# Patient Record
Sex: Male | Born: 1990 | Race: White | Hispanic: No | Marital: Single | Smoking: Never smoker
Health system: Southern US, Community
[De-identification: ages and names within clinical notes are randomized; demographics above are authoritative.]

---
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
## 2011-03-16 LAB — CBC
HCT: 38.9
Hemoglobin: 13.1
MCHC: 33.4
MCV: 86.4
Platelets: 169
RBC: 4.49
WBC: 5.8
WBC: 9.2

## 2011-03-16 LAB — COMPREHENSIVE METABOLIC PANEL
ALT: 27
AST: 39 — ABNORMAL HIGH
Albumin: 4.1
Calcium: 8.9
Chloride: 104
Creatinine, Ser: 0.91
Sodium: 138

## 2011-03-16 LAB — APTT: aPTT: 26

## 2011-03-16 LAB — DIFFERENTIAL
Eosinophils Absolute: 0.3
Eosinophils Relative: 6 — ABNORMAL HIGH
Lymphocytes Relative: 41
Lymphs Abs: 2.3
Monocytes Absolute: 0.5

## 2012-04-24 ENCOUNTER — Encounter (HOSPITAL_COMMUNITY): Payer: Self-pay | Admitting: Emergency Medicine

## 2012-04-24 ENCOUNTER — Emergency Department (HOSPITAL_COMMUNITY): Payer: Self-pay

## 2012-04-24 ENCOUNTER — Emergency Department (HOSPITAL_COMMUNITY)
Admission: EM | Admit: 2012-04-24 | Discharge: 2012-04-25 | Disposition: A | Discharge status: Home / Self Care | Payer: Self-pay | Attending: Emergency Medicine | Admitting: Emergency Medicine

## 2012-04-24 DIAGNOSIS — R197 Diarrhea, unspecified: Secondary | ICD-10-CM | POA: Insufficient documentation

## 2012-04-24 DIAGNOSIS — F411 Generalized anxiety disorder: Secondary | ICD-10-CM | POA: Insufficient documentation

## 2012-04-24 DIAGNOSIS — R0789 Other chest pain: Secondary | ICD-10-CM

## 2012-04-24 DIAGNOSIS — Y929 Unspecified place or not applicable: Secondary | ICD-10-CM | POA: Insufficient documentation

## 2012-04-24 DIAGNOSIS — W1801XA Striking against sports equipment with subsequent fall, initial encounter: Secondary | ICD-10-CM | POA: Insufficient documentation

## 2012-04-24 DIAGNOSIS — R002 Palpitations: Secondary | ICD-10-CM | POA: Insufficient documentation

## 2012-04-24 DIAGNOSIS — Y9375 Activity, martial arts: Secondary | ICD-10-CM | POA: Insufficient documentation

## 2012-04-24 DIAGNOSIS — R634 Abnormal weight loss: Secondary | ICD-10-CM | POA: Insufficient documentation

## 2012-04-24 LAB — CBC WITH DIFFERENTIAL/PLATELET
Eosinophils Relative: 3 % (ref 0–5)
HCT: 41 % (ref 39.0–52.0)
Hemoglobin: 15 g/dL (ref 13.0–17.0)
Lymphocytes Relative: 37 % (ref 12–46)
Lymphs Abs: 3.2 10*3/uL (ref 0.7–4.0)
MCV: 81.5 fL (ref 78.0–100.0)
Monocytes Absolute: 0.6 10*3/uL (ref 0.1–1.0)
Monocytes Relative: 7 % (ref 3–12)
Platelets: 186 10*3/uL (ref 150–400)
RBC: 5.03 MIL/uL (ref 4.22–5.81)
WBC: 8.6 10*3/uL (ref 4.0–10.5)

## 2012-04-24 MED ORDER — CYCLOBENZAPRINE HCL 10 MG PO TABS
5.0000 mg | ORAL_TABLET | Freq: Once | ORAL | Status: AC
  Administered 2012-04-24: 5 mg via ORAL
  Filled 2012-04-24: qty 1

## 2012-04-24 MED ORDER — IBUPROFEN 800 MG PO TABS
800.0000 mg | ORAL_TABLET | Freq: Once | ORAL | Status: AC
  Administered 2012-04-24: 800 mg via ORAL
  Filled 2012-04-24: qty 1

## 2012-04-24 NOTE — ED Provider Notes (Signed)
History     CSN: [id number]  Arrival date & time 04/24/12  2202   First MD Initiated Contact with Patient 04/24/12 2257      Chief Complaint  Patient presents with  . Chest Pain  . Anxiety    (Consider location/radiation/quality/duration/timing/severity/associated sxs/prior treatment) HPI  Patient reports over 2 weeks ago he was working at a [employer] and he did a karate jump and fell landing on his left chest. He states it knocked the breath out of him for a short while. He relates  several days later he started having a dull pain in his left chest and middle of his chest that would come and go. He states it would last a few seconds. He also had a sharp pain that was more of a  jabbing pain that would also come and go. He reports over the past 2 weeks the pains have started occurring more often and lasting longer. He relates today he has had chest pain all day that he describes as a pushing on his heart. He also is getting sharp jabs. He states he has palpitations and feels like his heart is racing at times. He has mild shortness of breath but denies cough he denies any pain or swelling in his legs. He relates a couple days ago he felt some tingling in his left hand. He thinks he may have had some wheezing with this episode. He also reports he is having increasing anxiety. He was seen by his family physician recently for anxiety and started on Xanax which he states is not helping.  Patient also reports a prolonged episode of diarrhea and has had significant weight loss from the 135 pound range down to 115 pounds. He relates he was evaluated by his physician and no etiology of the diarrhea was found. The diarrhea is improving but still not resolved.  He has an appointment to see his doctor in 3 days.  PCP Dr. Tanya Nones at [facility] family practice  History reviewed. No pertinent past medical history.  History reviewed. No pertinent past surgical history.  History reviewed.  No pertinent family history. No CAD or heart problems.   History  Substance Use Topics  . Smoking status: Never Smoker   . Smokeless tobacco: Not on file  . Alcohol Use: No  THC 3 times a day, stopped recently employed   Review of Systems  All other systems reviewed and are negative.    Allergies  Review of patient's allergies indicates no known allergies.  Home Medications   Current Outpatient Rx  Name  Route  Sig  Dispense  Refill  . ALPRAZOLAM 0.5 MG PO TABS   Oral   Take 0.5 mg by mouth 2 (two) times daily as needed. For anxiety           BP 130/76  Pulse 73  Temp 98.1 F (36.7 C) (Oral)  Resp 12  SpO2 100%  Vital signs normal    Physical Exam  Nursing note and vitals reviewed. Constitutional: He is oriented to person, place, and time. He appears well-developed and well-nourished.  Non-toxic appearance. He does not appear ill. No distress.  HENT:  Head: Normocephalic and atraumatic.  Right Ear: External ear normal.  Left Ear: External ear normal.  Nose: Nose normal. No mucosal edema or rhinorrhea.  Mouth/Throat: Oropharynx is clear and moist and mucous membranes are normal. No dental abscesses or uvula swelling.  Eyes: Conjunctivae normal and EOM are normal. Pupils are equal, round,  and reactive to light.  Neck: Normal range of motion and full passive range of motion without pain. Neck supple.  Cardiovascular: Normal rate, regular rhythm and normal heart sounds.  Exam reveals no gallop and no friction rub.   No murmur heard. Pulmonary/Chest: Effort normal and breath sounds normal. No respiratory distress. He has no wheezes. He has no rhonchi. He has no rales. He exhibits tenderness. He exhibits no crepitus.    Abdominal: Soft. Normal appearance and bowel sounds are normal. He exhibits no distension. There is no tenderness. There is no rebound and no guarding.  Musculoskeletal: Normal range of motion. He exhibits no edema and no tenderness.       Moves  all extremities well.   Neurological: He is alert and oriented to person, place, and time. He has normal strength. No cranial nerve deficit.  Skin: Skin is warm, dry and intact. No rash noted. No erythema. No pallor.  Psychiatric: He has a normal mood and affect. His speech is normal and behavior is normal. His mood appears not anxious.       Mildly anxious    ED Course  Procedures (including critical care time)   Medications  ALPRAZolam (XANAX) 0.5 MG tablet (not administered)  acetaminophen (TYLENOL) tablet 1,000 mg (not administered)  ibuprofen (ADVIL,MOTRIN) tablet 800 mg (800 mg Oral Given 04/24/12 2339)  cyclobenzaprine (FLEXERIL) tablet 5 mg (5 mg Oral Given 04/24/12 2339)    00:50Recheck chest pain is mildly better, now has a headache. Given results of xrays.  02:00 sleeping, chest pain is gone. Ready to go home.   Results for orders placed during the hospital encounter of 04/24/12  POCT I-STAT TROPONIN I      Component Value Range   Troponin i, poc 0.00  0.00 - 0.08 ng/mL   Comment 3           CBC WITH DIFFERENTIAL      Component Value Range   WBC 8.6  4.0 - 10.5 K/uL   RBC 5.03  4.22 - 5.81 MIL/uL   Hemoglobin 15.0  13.0 - 17.0 g/dL   HCT 16.1  09.6 - 04.5 %   MCV 81.5  78.0 - 100.0 fL   MCH 29.8  26.0 - 34.0 pg   MCHC 36.6 (*) 30.0 - 36.0 g/dL   RDW 40.9  81.1 - 91.4 %   Platelets 186  150 - 400 K/uL   Neutrophils Relative 52  43 - 77 %   Neutro Abs 4.5  1.7 - 7.7 K/uL   Lymphocytes Relative 37  12 - 46 %   Lymphs Abs 3.2  0.7 - 4.0 K/uL   Monocytes Relative 7  3 - 12 %   Monocytes Absolute 0.6  0.1 - 1.0 K/uL   Eosinophils Relative 3  0 - 5 %   Eosinophils Absolute 0.3  0.0 - 0.7 K/uL   Basophils Relative 1  0 - 1 %   Basophils Absolute 0.0  0.0 - 0.1 K/uL  COMPREHENSIVE METABOLIC PANEL      Component Value Range   Sodium 139  135 - 145 mEq/L   Potassium 3.7  3.5 - 5.1 mEq/L   Chloride 103  96 - 112 mEq/L   CO2 26  19 - 32 mEq/L   Glucose, Bld 86  70  - 99 mg/dL   BUN 26 (*) 6 - 23 mg/dL   Creatinine, Ser 7.82  0.50 - 1.35 mg/dL   Calcium 9.4  8.4 - 95.6  mg/dL   Total Protein 6.9  6.0 - 8.3 g/dL   Albumin 4.2  3.5 - 5.2 g/dL   AST 22  0 - 37 U/L   ALT 14  0 - 53 U/L   Alkaline Phosphatase 82  39 - 117 U/L   Total Bilirubin 0.3  0.3 - 1.2 mg/dL   GFR calc non Af Amer >90  >90 mL/min   GFR calc Af Amer >90  >90 mL/min  URINALYSIS, ROUTINE W REFLEX MICROSCOPIC      Component Value Range   Color, Urine YELLOW  YELLOW   APPearance CLEAR  CLEAR   Specific Gravity, Urine 1.017  1.005 - 1.030   pH 7.5  5.0 - 8.0   Glucose, UA NEGATIVE  NEGATIVE mg/dL   Hgb urine dipstick NEGATIVE  NEGATIVE   Bilirubin Urine NEGATIVE  NEGATIVE   Ketones, ur NEGATIVE  NEGATIVE mg/dL   Protein, ur NEGATIVE  NEGATIVE mg/dL   Urobilinogen, UA 0.2  0.0 - 1.0 mg/dL   Nitrite NEGATIVE  NEGATIVE   Leukocytes, UA NEGATIVE  NEGATIVE   Laboratory interpretation all normal   Dg Ribs Unilateral W/chest Left  04/24/2012  *RADIOLOGY REPORT*  Clinical Data: Chest pain and anxiety  LEFT RIBS AND CHEST - 3+ VIEW  Comparison: 04/21/2008  Findings: The heart size and mediastinal contours are within normal limits.  Both lungs are clear.  The visualized skeletal structures are unremarkable.  IMPRESSION: Negative exam.   Original Report Authenticated By: Signa Kell, M.D.       Date: 04/25/2012  Rate: 78  Rhythm: normal sinus rhythm  QRS Axis: normal  Intervals: normal  ST/T Wave abnormalities: normal  Conduction Disutrbances:none  Narrative Interpretation:   Old EKG Reviewed: none available    1. Chest wall pain   2. Weight loss   3. Palpitations   4. Diarrhea     New Prescriptions   METHOCARBAMOL (ROBAXIN) 500 MG TABLET    Take 1 or 2 po Q 6hrs for pain  ibuprofen  Plan discharge  Devoria Albe, MD, FACEP   MDM          Ward Givens, MD 04/25/12 [phone]

## 2012-04-24 NOTE — ED Notes (Signed)
Patient transported to X-ray 

## 2012-04-24 NOTE — ED Notes (Signed)
Pt reports chest pain x1-2 weeks intermitant and recurrent hx anxiety attacks denies cardiac hx

## 2012-04-25 LAB — URINALYSIS, ROUTINE W REFLEX MICROSCOPIC
Bilirubin Urine: NEGATIVE
Ketones, ur: NEGATIVE mg/dL
Nitrite: NEGATIVE
Specific Gravity, Urine: 1.017 (ref 1.005–1.030)
Urobilinogen, UA: 0.2 mg/dL (ref 0.0–1.0)

## 2012-04-25 LAB — COMPREHENSIVE METABOLIC PANEL
ALT: 14 U/L (ref 0–53)
CO2: 26 mEq/L (ref 19–32)
Calcium: 9.4 mg/dL (ref 8.4–10.5)
GFR calc Af Amer: >90 mL/min (ref >90)
GFR calc non Af Amer: >90 mL/min (ref >90)
Glucose, Bld: 86 mg/dL (ref 70–99)
Sodium: 139 mEq/L (ref 135–145)

## 2012-04-25 MED ORDER — METHOCARBAMOL 500 MG PO TABS: ORAL_TABLET | ORAL | Status: DC

## 2012-04-25 MED ORDER — ACETAMINOPHEN 500 MG PO TABS
1000.0000 mg | ORAL_TABLET | Freq: Once | ORAL | Status: AC
  Administered 2012-04-25: 1000 mg via ORAL
  Filled 2012-04-25: qty 2

## 2015-07-27 ENCOUNTER — Emergency Department (HOSPITAL_COMMUNITY): Payer: No Typology Code available for payment source

## 2015-07-27 ENCOUNTER — Emergency Department (HOSPITAL_COMMUNITY)
Admission: EM | Admit: 2015-07-27 | Discharge: 2015-07-27 | Disposition: A | Discharge status: Home / Self Care | Payer: No Typology Code available for payment source | Attending: Emergency Medicine | Admitting: Emergency Medicine

## 2015-07-27 ENCOUNTER — Encounter (HOSPITAL_COMMUNITY): Payer: Self-pay | Admitting: Emergency Medicine

## 2015-07-27 DIAGNOSIS — Y990 Civilian activity done for income or pay: Secondary | ICD-10-CM | POA: Insufficient documentation

## 2015-07-27 DIAGNOSIS — Y9389 Activity, other specified: Secondary | ICD-10-CM | POA: Insufficient documentation

## 2015-07-27 DIAGNOSIS — S93401A Sprain of unspecified ligament of right ankle, initial encounter: Secondary | ICD-10-CM | POA: Insufficient documentation

## 2015-07-27 DIAGNOSIS — Y92830 Public park as the place of occurrence of the external cause: Secondary | ICD-10-CM | POA: Insufficient documentation

## 2015-07-27 DIAGNOSIS — X58XXXA Exposure to other specified factors, initial encounter: Secondary | ICD-10-CM | POA: Insufficient documentation

## 2015-07-27 MED ORDER — IBUPROFEN 800 MG PO TABS
800.0000 mg | ORAL_TABLET | Freq: Once | ORAL | Status: AC
  Administered 2015-07-27: 800 mg via ORAL
  Filled 2015-07-27: qty 1

## 2015-07-27 MED ORDER — ACETAMINOPHEN 325 MG PO TABS
650.0000 mg | ORAL_TABLET | Freq: Once | ORAL | Status: AC
  Administered 2015-07-27: 650 mg via ORAL
  Filled 2015-07-27: qty 2

## 2015-07-27 NOTE — ED Notes (Signed)
Patient presents for right ankle injury. 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 Swelling noted to same. Denies numbness or tingling. Rates pain 4/10. Two aspirin PTA.

## 2015-07-27 NOTE — Discharge Instructions (Signed)
Ankle Sprain  An ankle sprain is an injury to the strong, fibrous tissues (ligaments) that hold the bones of your ankle joint together.   CAUSES  An ankle sprain is usually caused by a fall or by twisting your ankle. Ankle sprains most commonly occur when you step on the outer edge of your foot, and your ankle turns inward. People who participate in sports are more prone to these types of injuries.   SYMPTOMS    Pain in your ankle. The pain may be present at rest or only when you are trying to stand or walk.   Swelling.   Bruising. Bruising may develop immediately or within 1 to 2 days after your injury.   Difficulty standing or walking, particularly when turning corners or changing directions.  DIAGNOSIS   Your caregiver will ask you details about your injury and perform a physical exam of your ankle to determine if you have an ankle sprain. During the physical exam, your caregiver will press on and apply pressure to specific areas of your foot and ankle. Your caregiver will try to move your ankle in certain ways. An X-ray exam may be done to be sure a bone was not broken or a ligament did not separate from one of the bones in your ankle (avulsion fracture).   TREATMENT   Certain types of braces can help stabilize your ankle. Your caregiver can make a recommendation for this. Your caregiver may recommend the use of medicine for pain. If your sprain is severe, your caregiver may refer you to a surgeon who helps to restore function to parts of your skeletal system (orthopedist) or a physical therapist.  HOME CARE INSTRUCTIONS    Apply ice to your injury for 1-2 days or as directed by your caregiver. Applying ice helps to reduce inflammation and pain.    Put ice in a plastic bag.    Place a towel between your skin and the bag.    Leave the ice on for 15-20 minutes at a time, every 2 hours while you are awake.   Only take over-the-counter or prescription medicines for pain, discomfort, or fever as directed by  your caregiver.   Elevate your injured ankle above the level of your heart as much as possible for 2-3 days.   If your caregiver recommends crutches, use them as instructed. Gradually put weight on the affected ankle. Continue to use crutches or a cane until you can walk without feeling pain in your ankle.   If you have a plaster splint, wear the splint as directed by your caregiver. Do not rest it on anything harder than a pillow for the first 24 hours. Do not put weight on it. Do not get it wet. You may take it off to take a shower or bath.   You may have been given an elastic bandage to wear around your ankle to provide support. If the elastic bandage is too tight (you have numbness or tingling in your foot or your foot becomes cold and blue), adjust the bandage to make it comfortable.   If you have an air splint, you may blow more air into it or let air out to make it more comfortable. You may take your splint off at night and before taking a shower or bath. Wiggle your toes in the splint several times per day to decrease swelling.  SEEK MEDICAL CARE IF:    You have rapidly increasing bruising or swelling.   Your toes feel   extremely cold or you lose feeling in your foot.   Your pain is not relieved with medicine.  SEEK IMMEDIATE MEDICAL CARE IF:   Your toes are numb or blue.   You have severe pain that is increasing.  MAKE SURE YOU:    Understand these instructions.   Will watch your condition.   Will get help right away if you are not doing well or get worse.     This information is not intended to replace advice given to you by your health care provider. Make sure you discuss any questions you have with your health care provider.     Document Released: 05/31/2005 Document Revised: 06/21/2014 Document Reviewed: 06/12/2011  Elsevier Interactive Patient Education 2016 [employer]

## 2015-07-27 NOTE — ED Provider Notes (Signed)
CSN: [id number]     Arrival date & time 07/27/15  1740 History  By signing my name below, I, Edward Jarvis, attest that this documentation has been prepared under the direction and in the presence of Edward Fowler, PA-C.  Electronically Signed: Murriel Jarvis, ED Scribe. 07/27/2015. 7:45 PM.   Chief Complaint  Patient presents with  . Ankle Injury      Patient is a 25 y.o. male presenting with lower extremity injury. The history is provided by the patient. No language interpreter was used.  Ankle Injury   HPI Comments: Edward Jarvis is a 25 y.o. male who presents to the Emergency Department complaining of constant, moderate, worsening right ankle pain that has been present for 3 hours. 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 Pt states he has not put weight on it since the injury occurred. Pt reports associated tingling in his toes (which has resolved) and swelling of the foot. Pt states he took an Aspirin earlier with mild relief. Pt denies hitting his head or losing consciousness during the injury.   History reviewed. No pertinent past medical history. History reviewed. No pertinent past surgical history. No family history on file. Social History  Substance Use Topics  . Smoking status: Never Smoker   . Smokeless tobacco: None  . Alcohol Use: No    Review of Systems  A complete 10 system review of systems was obtained and all systems are negative except as noted in the HPI and PMH.    Allergies  Poison ivy extract  Home Medications   Prior to Admission medications   Medication Sig Start Date End Date Taking? Authorizing Provider  ALPRAZolam Prudy Feeler) 0.5 MG tablet Take 0.5 mg by mouth 2 (two) times daily as needed. For anxiety    Historical Provider, MD  methocarbamol (ROBAXIN) 500 MG tablet Take 1 or 2 po Q 6hrs for pain 04/25/12   Devoria Albe, MD   BP 109/73 mmHg  Pulse 70  Temp(Src) 98.4 F (36.9 C)  (Oral)  Resp 18  Ht  (1.727 m)  Wt 58.968 kg  BMI 19.77 kg/m2  SpO2 100% Physical Exam  Constitutional: He is oriented to person, place, and time. He appears well-developed and well-nourished.  HENT:  Head: Normocephalic and atraumatic.  Right Ear: External ear normal.  Left Ear: External ear normal.  Eyes: Conjunctivae are normal. No scleral icterus.  Neck: No tracheal deviation present.  Cardiovascular:  Pulses:      Dorsalis pedis pulses are 2+ on the right side, and 2+ on the left side.  Capillary refill less than 3 seconds distal to injury.   Pulmonary/Chest: Effort normal. No respiratory distress.  Abdominal: He exhibits no distension.  Musculoskeletal: He exhibits edema and tenderness.       Right ankle: He exhibits decreased range of motion (secondary to pain) and swelling. He exhibits no ecchymosis, no deformity, no laceration and normal pulse. Tenderness. Lateral malleolus, AITFL and CF ligament tenderness found. No posterior TFL tenderness found. Achilles tendon normal.  Right ankle with moderate swelling.  No ecchymosis, deformity, or erythema.  Lateral ankle TTP. Compartments soft and compressible. Decreased ROM secondary to pain.  Patient able to wiggle all toes without difficulty.  Neurological: He is alert and oriented to person, place, and time.  Strength and Sensation intact distal to injury.  Skin: Skin is warm and dry.  Psychiatric: He has a normal mood and  affect. His behavior is normal.    ED Course  Procedures (including critical care time)  DIAGNOSTIC STUDIES: Oxygen Saturation is 100% on room air, normal by my interpretation.    COORDINATION OF CARE: 7:43 PM Discussed treatment plan with pt at bedside and pt agreed to plan.   Labs Review Labs Reviewed - No data to display  Imaging Review Dg Ankle Complete Right  07/27/2015  CLINICAL DATA:  She swelling. EXAM: RIGHT ANKLE - COMPLETE 3+ VIEW COMPARISON:  She swelling. FINDINGS: The  mineralization and alignment are normal. There is no evidence of acute fracture or dislocation. There is no widening of the ankle mortise. The joint spaces are maintained. There is mild lateral soft tissue swelling. IMPRESSION: No acute osseous findings. Electronically Signed   By: Carey Bullocks M.D.   On: 07/27/2015 18:38   I have personally reviewed and evaluated these images and lab results as part of my medical decision-making.   EKG Interpretation None      MDM   Final diagnoses:  Ankle sprain, right, initial encounter    VSS, NAD.  NVI distal to injury.  Plain films negative for acute findings or fracture. Patient given ASO and crutches in ED.  Pain control with Motrin and Tylenol.  RICE therapy.  Follow up with orthopedics.  Discussed return precautions.  Patient agrees and acknowledges the above plan for discharge.   I personally performed the services described in this documentation, which was scribed in my presence. The recorded information has been reviewed and is accurate.    Edward Fowler, PA-C 07/27/15 1951  Edward Fowler, PA-C 07/27/15 1952  Lorre Nick, MD 07/30/15 [phone]

## 2016-04-21 ENCOUNTER — Encounter (HOSPITAL_COMMUNITY): Payer: Self-pay | Admitting: Family Medicine

## 2016-04-21 ENCOUNTER — Ambulatory Visit (HOSPITAL_COMMUNITY)
Admission: EM | Admit: 2016-04-21 | Discharge: 2016-04-21 | Disposition: A | Discharge status: Home / Self Care | Payer: Commercial Managed Care - HMO | Attending: Family Medicine | Admitting: Family Medicine

## 2016-04-21 DIAGNOSIS — S0990XA Unspecified injury of head, initial encounter: Secondary | ICD-10-CM | POA: Diagnosis not present
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
## 2016-04-21 NOTE — ED Provider Notes (Signed)
[facility]    CSN: [id number] Arrival date & time: 04/21/16  1138     History   Chief Complaint Chief Complaint  Patient presents with  . Head Injury    HPI Edward Jarvis is a 25 y.o. male with no PMH presenting after head trauma.   Yesterday evening he was installing a metal fence when a pole leaning up against a wall slid and fell onto the top of his head. It caused bleeding and mild headache without other symptoms. No LOC. He continued working, went to sleep, and when he woke up this morning he felt nauseous and had a single episode of vomiting. He has drunk water since that time and had no nausea. He notes no vision changes, hearing changes, syncope, focal weakness, numbness, tingling, changes in concentration or personality. Has a history of head trauma when he was 15 and fell from a skateboard.   HPI  History reviewed. No pertinent past medical history.  There are no active problems to display for this patient.   History reviewed. No pertinent surgical history.     Home Medications    Prior to Admission medications   Not on File    Family History History reviewed. No pertinent family history.  Social History Social History  Substance Use Topics  . Smoking status: Never Smoker  . Smokeless tobacco: Never Used  . Alcohol use No     Allergies   Poison ivy extract [poison ivy extract]   Review of Systems Review of Systems   Physical Exam Triage Vital Signs ED Triage Vitals [04/21/16 1206]  Enc Vitals Group     BP 105/64     Pulse Rate 66     Resp 16     Temp 98.3 F (36.8 C)     Temp src      SpO2 98 %     Weight      Height      Head Circumference      Peak Flow      Pain Score 4     Pain Loc      Pain Edu?      Excl. in GC?    No data found.   Updated Vital Signs BP 105/64   Pulse 66   Temp 98.3 F (36.8 C)   Resp 16   SpO2 98%   Physical Exam  Constitutional: He is oriented to person, place, and time. He  appears well-developed and well-nourished. No distress.  Eyes: EOM are normal. Pupils are equal, round, and reactive to light. No scleral icterus.  Neck: Neck supple. No JVD present.  Cardiovascular: Normal rate, regular rhythm, normal heart sounds and intact distal pulses.   No murmur heard. Pulmonary/Chest: Effort normal and breath sounds normal. No respiratory distress.  Abdominal: Soft. Bowel sounds are normal. He exhibits no distension. There is no tenderness.  Musculoskeletal: Normal range of motion. He exhibits no edema or tenderness.  Lymphadenopathy:    He has no cervical adenopathy.  Neurological: He is alert and oriented to person, place, and time. He displays normal reflexes. No cranial nerve deficit or sensory deficit. He exhibits normal muscle tone. Coordination normal.  fundi benign. One-leg stand normal, romberg neg.  Skin: Skin is warm and dry.  hemostatic small <0.5cm abrasion on superior right frontal scalp. no laceration  Vitals reviewed.  UC Treatments / Results  Labs (all labs ordered are listed, but only abnormal results are displayed) Labs Reviewed - No data to  display  EKG  EKG Interpretation None       Radiology No results found.  Procedures Procedures (including critical care time)  Medications Ordered in UC Medications - No data to display   Initial Impression / Assessment and Plan / UC Course  I have reviewed the triage vital signs and the nursing notes.  Pertinent labs & imaging results that were available during my care of the patient were reviewed by me and considered in my medical decision making (see chart for details).   Final Clinical Impressions(s) / UC Diagnoses   Final diagnoses:  Closed head injury, initial encounter   25 y.o. male presenting the day after low risk head trauma with nausea and an episode of emesis, both resolved. Advised supportive care including avoidance of high risk situations for head trauma. He doesn't play  contact sports and job does not put at risk of trauma. Symptoms of concussion reviewed and urged to go to ED if focal symptoms arise for head imaging .  New Prescriptions New Prescriptions   No medications on file     Tyrone Nineyan B Trexton Escamilla, MD 04/21/16 1319

## 2016-07-16 ENCOUNTER — Emergency Department (HOSPITAL_COMMUNITY)
Admission: EM | Admit: 2016-07-16 | Discharge: 2016-07-16 | Disposition: A | Discharge status: Home / Self Care | Payer: Commercial Managed Care - HMO | Attending: Emergency Medicine | Admitting: Emergency Medicine

## 2016-07-16 ENCOUNTER — Emergency Department (HOSPITAL_COMMUNITY): Payer: Commercial Managed Care - HMO

## 2016-07-16 ENCOUNTER — Encounter (HOSPITAL_COMMUNITY): Payer: Self-pay | Admitting: Emergency Medicine

## 2016-07-16 DIAGNOSIS — X58XXXA Exposure to other specified factors, initial encounter: Secondary | ICD-10-CM | POA: Diagnosis not present

## 2016-07-16 DIAGNOSIS — Z79899 Other long term (current) drug therapy: Secondary | ICD-10-CM | POA: Diagnosis not present

## 2016-07-16 DIAGNOSIS — S93601A Unspecified sprain of right foot, initial encounter: Secondary | ICD-10-CM

## 2016-07-16 DIAGNOSIS — Y9301 Activity, walking, marching and hiking: Secondary | ICD-10-CM | POA: Diagnosis not present

## 2016-07-16 DIAGNOSIS — Y929 Unspecified place or not applicable: Secondary | ICD-10-CM | POA: Insufficient documentation

## 2016-07-16 DIAGNOSIS — S99921A Unspecified injury of right foot, initial encounter: Secondary | ICD-10-CM | POA: Diagnosis present

## 2016-07-16 DIAGNOSIS — Y999 Unspecified external cause status: Secondary | ICD-10-CM | POA: Diagnosis not present

## 2016-07-16 MED ORDER — IBUPROFEN 800 MG PO TABS
800.0000 mg | ORAL_TABLET | Freq: Once | ORAL | Status: AC
  Administered 2016-07-16: 800 mg via ORAL
  Filled 2016-07-16: qty 1

## 2016-07-16 MED ORDER — OXYCODONE-ACETAMINOPHEN 5-325 MG PO TABS: 1.0000 | ORAL_TABLET | ORAL | 0 refills | Status: AC | PRN

## 2016-07-16 NOTE — Discharge Instructions (Signed)
Take acetaminophen, ibuprofen, or naproxen for less severe pain.

## 2016-07-16 NOTE — ED Notes (Signed)
ASO right ankle brace applied and crutches given and adjusted to height of 5'8". Patient demonstrated use of crutches and verbalized he was comfortable with use. D/C information reviewed and pt is aware of follow-up care and RX. Ice pack taken home with also.

## 2016-07-16 NOTE — ED Triage Notes (Signed)
Patient states that he took his dog out and stepped on his foot wrong. Patients says he heard something pop and not it hurts to walk on it.

## 2016-07-16 NOTE — ED Notes (Signed)
Patient states "I was walking my dog yesterday evening around 6-7pm. I stepped down and heard my right foot/ankle pop. It didn't alarm me because I am a runner and sometimes my joints pop. I was able to keep walking but it was a little sore. I went to bed and it was throbbing woke me up. I got up to use the bathroom and could barely put weight on my foot."

## 2016-07-16 NOTE — ED Provider Notes (Signed)
[facility] Provider Note   CSN: [id number] Arrival date & time: 07/16/16  0447     History   Chief Complaint Chief Complaint  Patient presents with  . Foot Injury    HPI Edward Jarvis is a 26 y.o. male.  He was DOG when he felt something pop in the lateral aspect of his right foot. Since then, he has been having increasing pain in the right foot with pain worse across the lateral aspect. He is unable to bear. He rates pain at 10/10. He denies actually twisting his foot. He states that he usually does walk on the outside part of his foot and his foot frequently pops, but this the first time it has hurt like this. He treated it with aspirin at home with no relief.   The history is provided by the patient.  Foot Injury      History reviewed. No pertinent past medical history.  There are no active problems to display for this patient.   History reviewed. No pertinent surgical history.     Home Medications    Prior to Admission medications   Medication Sig Start Date End Date Taking? Authorizing Provider  oxyCODONE-acetaminophen (PERCOCET) 5-325 MG tablet Take 1 tablet by mouth every 4 (four) hours as needed for moderate pain. 07/16/16   Dione Boozeavid Stella Encarnacion, MD    Family History History reviewed. No pertinent family history.  Social History Social History  Substance Use Topics  . Smoking status: Never Smoker  . Smokeless tobacco: Never Used  . Alcohol use No     Allergies   Poison ivy extract [poison ivy extract]   Review of Systems Review of Systems  All other systems reviewed and are negative.    Physical Exam Updated Vital Signs BP (!) 137/109 (BP Location: Left Arm)   Pulse 66   Temp 97.6 F (36.4 C) (Oral)   Resp 18   Ht 5\' 8"  (1.727 m)   Wt 132 lb (59.9 kg)   SpO2 100%   BMI 20.07 kg/m   Physical Exam  Nursing note and vitals reviewed.  26 year old male, resting comfortably and in no acute distress. Vital signs are significant for  hypertension. Oxygen saturation is 100%, which is normal. Head is normocephalic and atraumatic. PERRLA, EOMI. Oropharynx is clear. Neck is nontender and supple without adenopathy or JVD. Back is nontender and there is no CVA tenderness. Lungs are clear without rales, wheezes, or rhonchi. Chest is nontender. Heart has regular rate and rhythm without murmur. Abdomen is soft, flat, nontender without masses or hepatosplenomegaly and peristalsis is normoactive. Extremities: There is slight swelling across the dorsum and lateral aspects of the right foot with marked tenderness diffusely through the foot. Tenderness is worse along the lateral aspect. There is no instability. Neurovascular exam is intact. Skin is warm and dry without rash. Neurologic: Mental status is normal, cranial nerves are intact, there are no motor or sensory deficits.  ED Treatments / Results   Radiology Dg Foot Complete Right  Result Date: 07/16/2016 CLINICAL DATA:  Right lateral foot pain after injury. Stepped wrong and heard a pop. EXAM: RIGHT FOOT COMPLETE - 3+ VIEW COMPARISON:  None. FINDINGS: There is no evidence of fracture or dislocation. There is no evidence of arthropathy or other focal bone abnormality. Incidental note of multipartite sesamoids. Soft tissues are unremarkable. IMPRESSION: Negative radiographs of the right foot. Electronically Signed   By: Rubye OaksMelanie  Ehinger M.D.   On: 07/16/2016 05:17    Procedures  Procedures (including critical care time)  Medications Ordered in ED Medications  ibuprofen (ADVIL,MOTRIN) tablet 800 mg (not administered)     Initial Impression / Assessment and Plan / ED Course  I have reviewed the triage vital signs and the nursing notes.  Pertinent imaging results that were available during my care of the patient were reviewed by me and considered in my medical decision making (see chart for details).  Right foot injury which is probably a sprain. X-rays show no bony injury. He  is placed in an ankle splint orthotic and given crutches and discharged with instructions use over-the-counter analgesics as needed for pain. Given a prescription for a small number of oxycodone-acetaminophen tablets. Referred to orthopedics for follow-up. Review of old records shows no relevant past visits.  Final Clinical Impressions(s) / ED Diagnoses   Final diagnoses:  Sprain of right foot, initial encounter    New Prescriptions New Prescriptions   OXYCODONE-ACETAMINOPHEN (PERCOCET) 5-325 MG TABLET    Take 1 tablet by mouth every 4 (four) hours as needed for moderate pain.     Dione Booze, MD 07/16/16 [phone]

## 2017-02-05 ENCOUNTER — Emergency Department (HOSPITAL_COMMUNITY): Payer: 59

## 2017-02-05 ENCOUNTER — Emergency Department (HOSPITAL_COMMUNITY)
Admission: EM | Admit: 2017-02-05 | Discharge: 2017-02-05 | Disposition: A | Discharge status: Home / Self Care | Payer: 59 | Attending: Emergency Medicine | Admitting: Emergency Medicine

## 2017-02-05 ENCOUNTER — Other Ambulatory Visit: Payer: Self-pay

## 2017-02-05 ENCOUNTER — Encounter (HOSPITAL_COMMUNITY): Payer: Self-pay | Admitting: Emergency Medicine

## 2017-02-05 DIAGNOSIS — R042 Hemoptysis: Secondary | ICD-10-CM

## 2017-02-05 DIAGNOSIS — Z720 Tobacco use: Secondary | ICD-10-CM | POA: Diagnosis not present

## 2017-02-05 DIAGNOSIS — J069 Acute upper respiratory infection, unspecified: Secondary | ICD-10-CM

## 2017-02-05 LAB — CBC WITH DIFFERENTIAL/PLATELET
BASOS ABS: 0 10*3/uL (ref 0.0–0.1)
Basophils Relative: 1 %
EOS PCT: 5 %
Eosinophils Absolute: 0.4 10*3/uL (ref 0.0–0.7)
HCT: 43.4 % (ref 39.0–52.0)
Hemoglobin: 14.9 g/dL (ref 13.0–17.0)
LYMPHS ABS: 2.3 10*3/uL (ref 0.7–4.0)
LYMPHS PCT: 32 %
MCH: 29.1 pg (ref 26.0–34.0)
MCHC: 34.3 g/dL (ref 30.0–36.0)
MCV: 84.8 fL (ref 78.0–100.0)
MONO ABS: 0.6 10*3/uL (ref 0.1–1.0)
MONOS PCT: 8 %
NEUTROS ABS: 4 10*3/uL (ref 1.7–7.7)
Neutrophils Relative %: 54 %
Platelets: 180 10*3/uL (ref 150–400)
RBC: 5.12 MIL/uL (ref 4.22–5.81)
RDW: 12.5 % (ref 11.5–15.5)
WBC: 7.2 10*3/uL (ref 4.0–10.5)

## 2017-02-05 LAB — BASIC METABOLIC PANEL
ANION GAP: 7 (ref 5–15)
BUN: 11 mg/dL (ref 6–20)
CALCIUM: 9.3 mg/dL (ref 8.9–10.3)
CO2: 28 mmol/L (ref 22–32)
Chloride: 104 mmol/L (ref 101–111)
Creatinine, Ser: 0.96 mg/dL (ref 0.61–1.24)
GFR calc Af Amer: >60 mL/min (ref >60)
GFR calc non Af Amer: >60 mL/min (ref >60)
GLUCOSE: 87 mg/dL (ref 65–99)
Potassium: 5.1 mmol/L (ref 3.5–5.1)
Sodium: 139 mmol/L (ref 135–145)

## 2017-02-05 LAB — I-STAT TROPONIN, ED: Troponin i, poc: 0 ng/mL (ref 0.00–0.08)

## 2017-02-05 LAB — D-DIMER, QUANTITATIVE: D-Dimer, Quant: <0.27 ug/mL-FEU (ref 0.00–0.50)

## 2017-02-05 NOTE — ED Triage Notes (Signed)
Pt states that he coughed up blood x 2 this am in shower-- pt having no resp distress.

## 2017-02-05 NOTE — Discharge Instructions (Signed)
Your work up today has been reassuring, your labs, EKG, and chest xray all looked good. The symptoms you had today could be from a viral infection or from the change in weather, and the blood could have come from either your postnasal drainage or from the back of your throat. Continue to stay well-hydrated. Gargle warm salt water and spit it out, and use chloraseptic spray as needed for sore throat. Continue to alternate between Tylenol and Ibuprofen for pain or fever. Use Mucinex for cough suppression/expectoration of mucus. Use netipot and flonase to help with nasal congestion. May consider over-the-counter Benadryl or other antihistamine to decrease secretions and for help with your symptoms. STOP SMOKING! Follow up with your primary care doctor in 5-7 days for recheck of ongoing symptoms. Return to emergency department for emergent changing or worsening of symptoms.

## 2017-02-05 NOTE — ED Provider Notes (Signed)
[facility] Provider Note   CSN: [id number] Arrival date & time: 02/05/17  4098     History   Chief Complaint Chief Complaint  Patient presents with  . Hemoptysis    HPI Edward Jarvis is a 26 y.o. male who presents to the ED with complaints of 2 episodes of hemoptysis this morning when he was in the shower. Patient states that he started coughing after he got in the shower, had some brown mucus, and then had a to small plug of mucus that had red blood in it. He states that they were the size of about a quarter. He has continued to cough with brownish mucus production however has not had any further episodes of hemoptysis. Symptoms worsen with being in a hot shower, no treatments tried prior to arrival. He has had rhinorrhea since the weather change. He also reports mild chest discomfort after coughing, describes as 4/10 constant central chest pressure that is nonradiating, worse with cough, and with no treatments tried or alleviating factors. He admits to being a cigarette smoker occasionally. No sick contacts. He denies diaphoresis, lightheadedness, fevers, chills, sore throat, SOB, LE swelling, recent travel/surgery/immobilization, personal/family hx of DVT/PE, abd pain, N/V/D/C, hematuria, dysuria, myalgias, arthralgias, claudication, orthopnea, numbness, tingling, focal weakness, or any other complaints at this time. +FHx of MI in his MGF, who survived the MI and died of old age later on.    The history is provided by the patient and medical records. No language interpreter was used.  Cough  This is a new problem. The current episode started 1 to 2 hours ago. The problem occurs constantly. The problem has not changed since onset.The cough is productive of bloody sputum and productive of brown sputum. There has been no fever. Associated symptoms include chest pain and rhinorrhea. Pertinent negatives include no chills, no sore throat, no myalgias, no shortness of breath and no  wheezing. He has tried nothing for the symptoms. The treatment provided no relief. He is a smoker.    History reviewed. No pertinent past medical history.  There are no active problems to display for this patient.   History reviewed. No pertinent surgical history.     Home Medications    Prior to Admission medications   Medication Sig Start Date End Date Taking? Authorizing Provider  oxyCODONE-acetaminophen (PERCOCET) 5-325 MG tablet Take 1 tablet by mouth every 4 (four) hours as needed for moderate pain. 07/16/16   Dione Booze, MD    Family History No family history on file.  Social History Social History  Substance Use Topics  . Smoking status: Never Smoker  . Smokeless tobacco: Never Used  . Alcohol use No     Allergies   Poison ivy extract [poison ivy extract]   Review of Systems Review of Systems  Constitutional: Negative for chills, diaphoresis and fever.  HENT: Positive for rhinorrhea. Negative for sore throat.   Respiratory: Positive for cough. Negative for shortness of breath and wheezing.   Cardiovascular: Positive for chest pain. Negative for leg swelling.  Gastrointestinal: Negative for abdominal pain, constipation, diarrhea, nausea and vomiting.  Genitourinary: Negative for dysuria and hematuria.  Musculoskeletal: Negative for arthralgias and myalgias.  Skin: Negative for color change.  Allergic/Immunologic: Negative for immunocompromised state.  Neurological: Negative for weakness, light-headedness and numbness.  Psychiatric/Behavioral: Negative for confusion.   All other systems reviewed and are negative for acute change except as noted in the HPI.    Physical Exam Updated Vital Signs BP 112/75 (BP Location:  Left Arm)   Pulse 67   Temp 98.2 F (36.8 C) (Oral)   Resp 16   Ht 5\' 8"  (1.727 m)   Wt 61.7 kg (136 lb)   SpO2 100%   BMI 20.68 kg/m   Physical Exam  Constitutional: He is oriented to person, place, and time. Vital signs are normal.  He appears well-developed and well-nourished.  Non-toxic appearance. No distress.  Afebrile, nontoxic, NAD  HENT:  Head: Normocephalic and atraumatic.  Mouth/Throat: Uvula is midline and mucous membranes are normal. No trismus in the jaw. No uvula swelling. Posterior oropharyngeal erythema present. No oropharyngeal exudate, posterior oropharyngeal edema or tonsillar abscesses. Tonsils are 0 on the right. Tonsils are 0 on the left. No tonsillar exudate.  Oropharynx with very mild injection on the palatine arches, without uvular swelling or deviation, no trismus or drooling, no tonsillar swelling or erythema, no exudates.    Eyes: Conjunctivae and EOM are normal. Right eye exhibits no discharge. Left eye exhibits no discharge.  Neck: Normal range of motion. Neck supple.  Cardiovascular: Regular rhythm, normal heart sounds and intact distal pulses.  Bradycardia present.  Exam reveals no gallop and no friction rub.   No murmur heard. Intermittently bradycardic in the 50s-60s, reg rhythm, nl s1/s2, no m/r/g, distal pulses intact, no pedal edema   Pulmonary/Chest: Effort normal and breath sounds normal. No respiratory distress. He has no decreased breath sounds. He has no wheezes. He has no rhonchi. He has no rales. He exhibits no tenderness, no crepitus, no deformity and no retraction.  CTAB in all lung fields, no w/r/r, no hypoxia or increased WOB, speaking in full sentences, SpO2 99% on RA  Chest wall nonTTP without crepitus, deformities, or retractions   Abdominal: Soft. Normal appearance and bowel sounds are normal. He exhibits no distension. There is no tenderness. There is no rigidity, no rebound, no guarding, no CVA tenderness, no tenderness at McBurney's point and negative Murphy's sign.  Musculoskeletal: Normal range of motion.  MAE x4 Strength and sensation grossly intact in all extremities Distal pulses intact Gait steady No pedal edema, neg homan's bilaterally   Neurological: He is alert  and oriented to person, place, and time. He has normal strength. No sensory deficit.  Skin: Skin is warm, dry and intact. No rash noted.  Psychiatric: He has a normal mood and affect.  Nursing note and vitals reviewed.    ED Treatments / Results  Labs (all labs ordered are listed, but only abnormal results are displayed) Labs Reviewed  CBC WITH DIFFERENTIAL/PLATELET  BASIC METABOLIC PANEL  D-DIMER, QUANTITATIVE (NOT AT [facility])  I-STAT TROPONIN, ED    EKG  EKG Interpretation  Date/Time:  Saturday February 05 2017 12:00:02 EDT Ventricular Rate:  54 PR Interval:    QRS Duration: 89 QT Interval:  432 QTC Calculation: 410 R Axis:   88 Text Interpretation:  Sinus rhythm Probable left ventricular hypertrophy ST elev, probable normal early repol pattern No significant change since last tracing Confirmed by Shaune Pollack [phone]) on 02/05/2017 2:32:48 PM       Radiology Dg Chest 2 View  Result Date: 02/05/2017 CLINICAL DATA:  Hemoptysis this morning. EXAM: CHEST  2 VIEW COMPARISON:  04/24/2012 FINDINGS: The heart size and mediastinal contours are within normal limits. Both lungs are clear. The visualized skeletal structures are unremarkable. IMPRESSION: Negative.  No active cardiopulmonary disease. Electronically Signed   By: Myles Rosenthal M.D.   On: 02/05/2017 09:20    Procedures Procedures (including critical care  time)  Medications Ordered in ED Medications - No data to display   Initial Impression / Assessment and Plan / ED Course  I have reviewed the triage vital signs and the nursing notes.  Pertinent labs & imaging results that were available during my care of the patient were reviewed by me and considered in my medical decision making (see chart for details).     26 y.o. male here with 2 episodes of small hemoptysis mixed with mucous, cough started this AM in the shower. Having some chest pressure and rhinorrhea. +Smoker. On exam, clear lungs, intermittently bradycardic,  no tachycardia or hypoxia, no LE swelling. No RFs for PE/DVT. Throat mildly injected. Could be from oropharyngeal etiology, however he's describing a quarter sized amount of red blood, so it's possible it's from his lungs; PE still on the list of differentials, however unlikely. CXR neg. Will get EKG, labs, and D-dimer; will reassess shortly. Pt declines wanting anything for pain.   2:37 PM EKG without acute ischemic findings, early repol pattern, unchanged from prior. Pt without any further hemoptysis while here. CBC w/diff WNL. BMP WNL. Trop neg. D-Dimer negative, therefore highly doubt PE. Hemoptysis likely was just blood tinged sputum from mucosal irritation, possibly from his oropharynx. Advised OTC remedies for help with cough/URI symptoms, likely either weather-change related vs viral illness. Smoking cessation strongly advised. F/up with PCP in 5-7 days for recheck of symptoms. I explained the diagnosis and have given explicit precautions to return to the ER including for any other new or worsening symptoms. The patient understands and accepts the medical plan as it's been dictated and I have answered their questions. Discharge instructions concerning home care and prescriptions have been given. The patient is STABLE and is discharged to home in good condition.    Final Clinical Impressions(s) / ED Diagnoses   Final diagnoses:  Cough with hemoptysis  Upper respiratory tract infection, unspecified type  Tobacco user    New Prescriptions New Prescriptions   No medications on [address] [place], [place] 02/05/17 1438    Shaune Pollack, MD 02/07/17 [phone]

## 2019-02-09 ENCOUNTER — Ambulatory Visit (HOSPITAL_COMMUNITY)
Admission: EM | Admit: 2019-02-09 | Discharge: 2019-02-09 | Disposition: A | Discharge status: Home / Self Care | Payer: Self-pay | Attending: Emergency Medicine | Admitting: Emergency Medicine

## 2019-02-09 ENCOUNTER — Other Ambulatory Visit: Payer: Self-pay

## 2019-02-09 ENCOUNTER — Encounter (HOSPITAL_COMMUNITY): Payer: Self-pay | Admitting: Emergency Medicine

## 2019-02-09 DIAGNOSIS — R112 Nausea with vomiting, unspecified: Secondary | ICD-10-CM

## 2019-02-09 MED ORDER — ONDANSETRON 4 MG PO TBDP: 4.0000 mg | ORAL_TABLET | Freq: Three times a day (TID) | ORAL | 0 refills | Status: AC | PRN

## 2019-02-09 MED ORDER — ONDANSETRON 4 MG PO TBDP
ORAL_TABLET | ORAL | Status: AC
  Filled 2019-02-09: qty 1

## 2019-02-09 MED ORDER — ONDANSETRON 4 MG PO TBDP
4.0000 mg | ORAL_TABLET | Freq: Once | ORAL | Status: AC
  Administered 2019-02-09: 15:00:00 4 mg via ORAL

## 2019-02-09 NOTE — ED Triage Notes (Signed)
Pt here for nausea and vomiting x 1 yesterday

## 2019-02-09 NOTE — ED Provider Notes (Signed)
[facility]    CSN: [id number] Arrival date & time: 02/09/19  1428      History   Chief Complaint Chief Complaint  Patient presents with  . Nausea    HPI Edward Jarvis is a 28 y.o. male.   HPI Edward Jarvis is a 28 y.o. male presenting to UC with c/o nausea and vomiting that started yesterday. One episode of vomiting and another episode of dry heaving.  Mild generalized abdominal cramping.  Pt still c/o nausea but denies diarrhea, fever, chills, cough, congestion, HA, dizziness or sore throat. His work encouraged him to be evaluated today. Pt believes symptoms are due to eating spicy chicken the night before symptoms started.  He tried pepto-bismol with some relief.  No urinary symptoms, sick contacts or recent travel.    BP slightly low in triage, per medical records, hx of same.   History reviewed. No pertinent past medical history.  There are no active problems to display for this patient.   History reviewed. No pertinent surgical history.     Home Medications    Prior to Admission medications   Medication Sig Start Date End Date Taking? Authorizing Provider  ondansetron (ZOFRAN-ODT) 4 MG disintegrating tablet Take 1 tablet (4 mg total) by mouth every 8 (eight) hours as needed for nausea or vomiting. 02/09/19   Lurene ShadowPhelps, Shaelyn Decarli O, PA-C  oxyCODONE-acetaminophen (PERCOCET) 5-325 MG tablet Take 1 tablet by mouth every 4 (four) hours as needed for moderate pain. Patient not taking: Reported on 02/05/2017 07/16/16   Dione BoozeGlick, David, MD    Family History History reviewed. No pertinent family history.  Social History Social History   Tobacco Use  . Smoking status: Never Smoker  . Smokeless tobacco: Never Used  Substance Use Topics  . Alcohol use: No  . Drug use: No     Allergies   Poison ivy extract [poison ivy extract]   Review of Systems Review of Systems  Constitutional: Negative for chills and fever.  HENT: Negative for congestion, ear pain, sore  throat, trouble swallowing and voice change.   Respiratory: Negative for cough and shortness of breath.   Cardiovascular: Negative for chest pain and palpitations.  Gastrointestinal: Positive for abdominal pain (mild cramping), nausea and vomiting. Negative for diarrhea.  Musculoskeletal: Negative for arthralgias, back pain and myalgias.  Skin: Negative for rash.  Neurological: Negative for dizziness, light-headedness and headaches.     Physical Exam Triage Vital Signs ED Triage Vitals  Enc Vitals Group     BP 02/09/19 1444 (!) 97/54     Pulse Rate 02/09/19 1444 73     Resp 02/09/19 1444 16     Temp 02/09/19 1444 98.6 F (37 C)     Temp Source 02/09/19 1444 Tympanic     SpO2 02/09/19 1444 100 %     Weight --      Height --      Head Circumference --      Peak Flow --      Pain Score 02/09/19 1448 0     Pain Loc --      Pain Edu? --      Excl. in GC? --    No data found.  Updated Vital Signs BP (!) 97/54 (BP Location: Left Arm)   Pulse 73   Temp 98.6 F (37 C) (Tympanic)   Resp 16   SpO2 100%   Visual Acuity Right Eye Distance:   Left Eye Distance:   Bilateral Distance:  Right Eye Near:   Left Eye Near:    Bilateral Near:     Physical Exam Vitals signs and nursing note reviewed.  Constitutional:      General: He is not in acute distress.    Appearance: Normal appearance. He is well-developed. He is not ill-appearing, toxic-appearing or diaphoretic.  HENT:     Head: Normocephalic and atraumatic.     Right Ear: Tympanic membrane normal.     Left Ear: Tympanic membrane normal.     Nose: Nose normal.     Mouth/Throat:     Lips: Pink.     Mouth: Mucous membranes are moist.     Pharynx: Oropharynx is clear. Uvula midline.  Neck:     Musculoskeletal: Normal range of motion.  Cardiovascular:     Rate and Rhythm: Normal rate and regular rhythm.  Pulmonary:     Effort: Pulmonary effort is normal.     Breath sounds: Normal breath sounds.  Abdominal:      General: There is no distension.     Palpations: Abdomen is soft.     Tenderness: There is no abdominal tenderness. There is no right CVA tenderness or left CVA tenderness.  Musculoskeletal: Normal range of motion.  Skin:    General: Skin is warm and dry.     Capillary Refill: Capillary refill takes less than 2 seconds.  Neurological:     Mental Status: He is alert and oriented to person, place, and time.  Psychiatric:        Behavior: Behavior normal.      UC Treatments / Results  Labs (all labs ordered are listed, but only abnormal results are displayed) Labs Reviewed - No data to display  EKG   Radiology No results found.  Procedures Procedures (including critical care time)  Medications Ordered in UC Medications  ondansetron (ZOFRAN-ODT) disintegrating tablet 4 mg (4 mg Oral Given 02/09/19 1506)  ondansetron (ZOFRAN-ODT) 4 MG disintegrating tablet (has no administration in time range)    Initial Impression / Assessment and Plan / UC Course  I have reviewed the triage vital signs and the nursing notes.  Pertinent labs & imaging results that were available during my care of the patient were reviewed by me and considered in my medical decision making (see chart for details).     Benign exam Will tx conservatively  No indication for further workup at this time Encouraged good hydration Work note provided AVS provided.  Final Clinical Impressions(s) / UC Diagnoses   Final diagnoses:  Nausea and vomiting in adult patient     Discharge Instructions      Be sure to get a lot of rest and stay well hydrated with sports drinks, water, diluted juices, and clear sodas.  Avoid fried fatty food, spicy food, and milk as these foods can cause worsening stomach upset.   Please follow up with family medicine in 2-3 days if not improving.  Call 911 or go to the hospital if symptoms worsening- severe abdominal pain, fever, unable to keep down fluids, or other new  concerning symptoms develop.     ED Prescriptions    Medication Sig Dispense Auth. Provider   ondansetron (ZOFRAN-ODT) 4 MG disintegrating tablet Take 1 tablet (4 mg total) by mouth every 8 (eight) hours as needed for nausea or vomiting. 10 tablet Noe Gens, PA-C     Controlled Substance Prescriptions [place] Controlled Substance Registry consulted? Not Applicable   Tyrell Antonio 02/09/19 1555

## 2019-02-09 NOTE — Discharge Instructions (Signed)
°  Be sure to get a lot of rest and stay well hydrated with sports drinks, water, diluted juices, and clear sodas.  Avoid fried fatty food, spicy food, and milk as these foods can cause worsening stomach upset.   Please follow up with family medicine in 2-3 days if not improving.  Call 911 or go to the hospital if symptoms worsening- severe abdominal pain, fever, unable to keep down fluids, or other new concerning symptoms develop.

## 2021-06-23 ENCOUNTER — Emergency Department (HOSPITAL_BASED_OUTPATIENT_CLINIC_OR_DEPARTMENT_OTHER): Payer: Commercial Managed Care - PPO | Admitting: Radiology

## 2021-06-23 ENCOUNTER — Emergency Department (HOSPITAL_BASED_OUTPATIENT_CLINIC_OR_DEPARTMENT_OTHER)
Admission: EM | Admit: 2021-06-23 | Discharge: 2021-06-23 | Disposition: A | Discharge status: Home / Self Care | Payer: Commercial Managed Care - PPO | Attending: Emergency Medicine | Admitting: Emergency Medicine

## 2021-06-23 ENCOUNTER — Other Ambulatory Visit: Payer: Self-pay

## 2021-06-23 ENCOUNTER — Encounter (HOSPITAL_BASED_OUTPATIENT_CLINIC_OR_DEPARTMENT_OTHER): Payer: Self-pay

## 2021-06-23 DIAGNOSIS — R0781 Pleurodynia: Secondary | ICD-10-CM | POA: Diagnosis present

## 2021-06-23 MED ORDER — KETOROLAC TROMETHAMINE 30 MG/ML IJ SOLN
30.0000 mg | Freq: Once | INTRAMUSCULAR | Status: AC
  Administered 2021-06-23: 30 mg via INTRAMUSCULAR
  Filled 2021-06-23: qty 1

## 2021-06-23 MED ORDER — DICLOFENAC SODIUM 1 % EX GEL: 2.0000 g | Freq: Four times a day (QID) | CUTANEOUS | 0 refills | Status: AC

## 2021-06-23 NOTE — ED Provider Notes (Signed)
[facility] EMERGENCY DEPT Provider Note   CSN: [id number] Arrival date & time: 06/23/21  2032     History  Chief Complaint  Patient presents with   Torso Pain    Edward Jarvis is a 31 y.o. male. No PMH. Presents with Left rib pain. He states that he was stretching two days ago when the lower part of his left rib started hurting. It has been constant since then and worse with sharp movements. He does not remember any specific trauma to the area. He has not had any difficulty breathing or any anterior chest wall pain.   HPI     Home Medications Prior to Admission medications   Medication Sig Start Date End Date Taking? Authorizing Provider  diclofenac Sodium (VOLTAREN) 1 % GEL Apply 2 g topically 4 (four) times daily. 06/23/21  Yes Garo Heidelberg, Finis Bud, PA-C  ondansetron (ZOFRAN-ODT) 4 MG disintegrating tablet Take 1 tablet (4 mg total) by mouth every 8 (eight) hours as needed for nausea or vomiting. 02/09/19   Lurene Shadow, PA-C  oxyCODONE-acetaminophen (PERCOCET) 5-325 MG tablet Take 1 tablet by mouth every 4 (four) hours as needed for moderate pain. Patient not taking: Reported on 02/05/2017 07/16/16   Dione Booze, MD      Allergies    Poison ivy extract [poison ivy extract]    Review of Systems   Review of Systems  Musculoskeletal:        Lateral left chest wall pain  All other systems reviewed and are negative.  Physical Exam Updated Vital Signs BP 118/60 (BP Location: Right Arm)    Pulse 63    Temp 98 F (36.7 C) (Oral)    Resp 12    Ht 5\' 8"  (1.727 m)    Wt 61.7 kg    SpO2 100%    BMI 20.68 kg/m  Physical Exam Vitals and nursing note reviewed.  Constitutional:      General: He is not in acute distress.    Appearance: Normal appearance. He is well-developed. He is not ill-appearing, toxic-appearing or diaphoretic.  HENT:     Head: Normocephalic and atraumatic.     Nose: No nasal deformity.     Mouth/Throat:     Lips: Pink. No lesions.  Eyes:      General: Gaze aligned appropriately. No scleral icterus.       Right eye: No discharge.        Left eye: No discharge.     Conjunctiva/sclera: Conjunctivae normal.     Right eye: Right conjunctiva is not injected. No exudate or hemorrhage.    Left eye: Left conjunctiva is not injected. No exudate or hemorrhage. Pulmonary:     Effort: Pulmonary effort is normal. No respiratory distress.     Comments: Chest wall tenderness to the left inferior rib cage. No stepoffs noted. Rib 10-12 are painful especially in between the bones. Chest:     Chest wall: Tenderness present.  Abdominal:     General: Abdomen is flat. There is no distension.     Palpations: Abdomen is soft. There is no mass.     Tenderness: There is no abdominal tenderness. There is no guarding or rebound.     Comments: No abdominal ttp.   Skin:    General: Skin is warm and dry.     Coloration: Skin is not jaundiced or pale.     Findings: No bruising, erythema, lesion or rash.     Comments: No bruising or rash overlying painful  area  Neurological:     Mental Status: He is alert and oriented to person, place, and time.  Psychiatric:        Mood and Affect: Mood normal.        Speech: Speech normal.        Behavior: Behavior normal. Behavior is cooperative.    ED Results / Procedures / Treatments   Labs (all labs ordered are listed, but only abnormal results are displayed) Labs Reviewed - No data to display  EKG None  Radiology DG Ribs Unilateral W/Chest Left  Result Date: 06/23/2021 CLINICAL DATA:  Rib pain. EXAM: LEFT RIBS AND CHEST - 3+ VIEW COMPARISON:  Chest x-ray 02/05/2017. FINDINGS: No fracture or other bone lesions are seen involving the ribs. There is no evidence of pneumothorax or pleural effusion. Both lungs are clear. Heart size and mediastinal contours are within normal limits. IMPRESSION: Negative. Electronically Signed   By: Darliss Cheney M.D.   On: 06/23/2021 21:16    Procedures Procedures    Medications Ordered in ED Medications  ketorolac (TORADOL) 30 MG/ML injection 30 mg (30 mg Intramuscular Given 06/23/21 2130)    ED Course/ Medical Decision Making/ A&P                           Medical Decision Making Problems Addressed: Rib pain on left side: self-limited or minor problem  Amount and/or Complexity of Data Reviewed Radiology: ordered and independent interpretation performed. Decision-making details documented in ED Course.  Risk OTC drugs. Prescription drug management.   Patient presents with left inferior rib cage pain. This was after stretching about three days ago. No blunt trauma to the area. Exam notable for ttp overlying ribs 10-12 on the lateral left side.  XR is negative for any fractures or PTX. I suspect that this is costochondritis. Recommend antiinflammatory treatment. F/U with PCP  Final Clinical Impression(s) / ED Diagnoses Final diagnoses:  Rib pain on left side    Rx / DC Orders ED Discharge Orders          Ordered    diclofenac Sodium (VOLTAREN) 1 % GEL  4 times daily        06/23/21 2130              Therese Sarah 06/23/21 2134    Cathren Laine, MD 06/23/21 2241

## 2021-06-23 NOTE — Discharge Instructions (Signed)
Your Chest Xray did not show any fracture or other abnormality. I recommend using antiinflammatories for your symptoms. You can use 600-800 mg Ibuprofen every 8 hours for one week and see if your symptoms improve.  You can also try stretching, heating pads. I have also prescribed you an antiinflammatory topical gel that you can put over the painful area up to four times a day. You should follow up with your PCP or return to the Emergency Department for further concerns.

## 2021-06-23 NOTE — ED Triage Notes (Signed)
Patient here POV from Home with Left Torso Pain.  Patient states he began to have Pain to Left Mid Torso after Stretching approximately 2 days PTA. Worse with Certain Movements. Pain constant and worsens with Movements. Sharp in Arial.  Patient uncomfortable during Triage. A&Ox4. GCS 15. Ambulatory.

## 2021-06-23 NOTE — ED Notes (Signed)
Pt verbalizes understanding of discharge instructions. Opportunity for questioning and answers were provided. Pt discharged from ED to home.   ? ?

## 2021-07-03 ENCOUNTER — Other Ambulatory Visit: Payer: Self-pay

## 2021-07-03 ENCOUNTER — Emergency Department (HOSPITAL_COMMUNITY)
Admission: EM | Admit: 2021-07-03 | Discharge: 2021-07-03 | Disposition: A | Discharge status: Home / Self Care | Payer: Commercial Managed Care - PPO | Attending: Emergency Medicine | Admitting: Emergency Medicine

## 2021-07-03 ENCOUNTER — Emergency Department (HOSPITAL_COMMUNITY): Payer: Commercial Managed Care - PPO

## 2021-07-03 DIAGNOSIS — X500XXA Overexertion from strenuous movement or load, initial encounter: Secondary | ICD-10-CM | POA: Insufficient documentation

## 2021-07-03 DIAGNOSIS — Y990 Civilian activity done for income or pay: Secondary | ICD-10-CM | POA: Diagnosis not present

## 2021-07-03 DIAGNOSIS — S39011A Strain of muscle, fascia and tendon of abdomen, initial encounter: Secondary | ICD-10-CM | POA: Diagnosis not present

## 2021-07-03 DIAGNOSIS — S3991XA Unspecified injury of abdomen, initial encounter: Secondary | ICD-10-CM | POA: Diagnosis present

## 2021-07-03 MED ORDER — METHOCARBAMOL 500 MG PO TABS: 500.0000 mg | ORAL_TABLET | Freq: Two times a day (BID) | ORAL | 0 refills | Status: AC

## 2021-07-03 NOTE — Discharge Instructions (Addendum)
Please use Tylenol or ibuprofen for pain.  You may use 600 mg ibuprofen every 6 hours or 1000 mg of Tylenol every 6 hours.  You may choose to alternate between the 2.  This would be most effective.  Not to exceed 4 g of Tylenol within 24 hours.  Not to exceed 3200 mg ibuprofen 24 hours.  You can take the muscle relaxant in addition to the above up to twice daily. 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

## 2021-07-03 NOTE — ED Provider Notes (Signed)
[facility]-EMERGENCY DEPT Provider Note   CSN: [id number] Arrival date & time: 07/03/21  1010     History  Chief Complaint  Patient presents with   rib cage pain    Edward Jarvis is a 31 y.o. male with no significant past medical history who presents with concern for left flank versus rib pain for the last few days, worsening today.  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  Patient reports that he was lifting something earlier when he felt some pain in the left ribs and a pop.  He denies any chest pain at rest, shortness of breath.  No history of spontaneous pneumothorax in the past.  No other significant medical history, no chronic medications.  Patient denies abdominal pain, constipation, diarrhea, vomiting, nausea.  Patient has not take anything for pain prior to arrival.  No, family history of ACS, no history of diabetes, tobacco use, no exertional chest pain, diaphoresis.  HPI     Home Medications Prior to Admission medications   Medication Sig Start Date End Date Taking? Authorizing Provider  methocarbamol (ROBAXIN) 500 MG tablet Take 1 tablet (500 mg total) by mouth 2 (two) times daily. 07/03/21  Yes Marvalene Barrett H, PA-C  diclofenac Sodium (VOLTAREN) 1 % GEL Apply 2 g topically 4 (four) times daily. 06/23/21   Loeffler, Finis Bud, PA-C  ondansetron (ZOFRAN-ODT) 4 MG disintegrating tablet Take 1 tablet (4 mg total) by mouth every 8 (eight) hours as needed for nausea or vomiting. 02/09/19   Lurene Shadow, PA-C  oxyCODONE-acetaminophen (PERCOCET) 5-325 MG tablet Take 1 tablet by mouth every 4 (four) hours as needed for moderate pain. Patient not taking: Reported on 02/05/2017 07/16/16   Dione Booze, MD      Allergies    Poison ivy extract [poison ivy extract]    Review of Systems   Review of Systems  Genitourinary:  Positive for flank pain.  All other systems reviewed and are negative.  Physical  Exam Updated Vital Signs BP (!) 123/96 (BP Location: Left Arm)    Pulse (!) 54    Temp (!) 97.4 F (36.3 C) (Oral)    Resp 18    Ht 5\' 8"  (1.727 m)    Wt 62 kg    SpO2 100%    BMI 20.78 kg/m  Physical Exam Vitals and nursing note reviewed.  Constitutional:      General: He is not in acute distress.    Appearance: Normal appearance.  HENT:     Head: Normocephalic and atraumatic.  Eyes:     General:        Right eye: No discharge.        Left eye: No discharge.  Cardiovascular:     Rate and Rhythm: Normal rate and regular rhythm.  Pulmonary:     Effort: Pulmonary effort is normal. No respiratory distress.     Breath sounds: No stridor. No wheezing.  Musculoskeletal:        General: No deformity.     Comments: Tenderness to palpation left upper abdominal muscles, serratus muscle distribution.  Intact range of motion of cervical, thoracic, lumbar spine with some pain with bending.  Additional pain with lateral bending to the left.  No bruising noted, but pain is reproducible on palpation.  Skin:    General: Skin is warm and dry.  Neurological:     Mental Status: He is alert and oriented to person, place,  and time.  Psychiatric:        Mood and Affect: Mood normal.        Behavior: Behavior normal.    ED Results / Procedures / Treatments   Labs (all labs ordered are listed, but only abnormal results are displayed) Labs Reviewed - No data to display  EKG None  Radiology DG Chest 2 View  Result Date: 07/03/2021 CLINICAL DATA:  31 year old male with shortness of breath. Left rib and flank pain with movement after "felt a pop". Symptoms for 1 week. EXAM: CHEST - 2 VIEW COMPARISON:  Chest and left rib series 06/23/2021 and earlier. FINDINGS: Lung volumes and mediastinal contours remain normal. Visualized tracheal air column is within normal limits. Both lungs appear clear. No pneumothorax or pleural effusion. Bone mineralization is within normal limits. Left ribs appears stable and  intact. No osseous abnormality identified. Negative visible bowel gas. IMPRESSION: Negative. No cardiopulmonary abnormality. Electronically Signed   By: Odessa Fleming M.D.   On: 07/03/2021 12:00    Procedures Procedures    Medications Ordered in ED Medications - No data to display  ED Course/ Medical Decision Making/ A&P Clinical Course as of 07/03/21 1233  Fri Jul 03, 2021  1159 Patient with left-sided flank pain that is worse with movement for the last 2 days, started when he was working, reports that he does a lot of heavy lifting.  Has not taken anything for pain at this time.  Reports still pain at rest, sharp pain with lateral movement.  Some pain with deep breathing.  No cough, sore throat, fever, chills, chest pain, abdominal pain, or diarrhea. [CP]    Clinical Course User Index [CP] Olene Floss, PA-C                           Medical Decision Making Amount and/or Complexity of Data Reviewed Radiology: ordered.  Risk Prescription drug management.   This is an overall healthy male who is well-appearing who presents with 3 days of left rib versus flank versus abdominal pain secondary to heavy lifting, bending.  My differential diagnosis includes ACS, spontaneous pneumothorax, musculoskeletal injury including strain of serratus versus upper abdominal muscles versus obliques versus pectoralis.  This is not an exhaustive differential.  Also considered nephrolithiasis versus splenic infarct versus constipation versus diverticulitis.  Without other GI complaints, minimal clinical concern at this time.  My physical exam is significant for pain reproducible on palpation, stretching.  Minimal clinical concern for ACS as patient does not have chest pain at rest, few risk factors for ACS.  Low cardiac risk factors.  We will not obtain EKG, troponin.  This plan with patient who agrees.  Obtain chest x-ray to evaluate for spontaneous pneumothorax in context of young thin male.  Radiographic  imaging of the chest is without abnormality.  I agree with radiologist interpretation.  In context of known injury mechanism with stretching, pain reproducible with movement, and a muscular distribution favor upper abdominal/oblique/serratus strain.  Encouraged high-protein, Tylenol, will prescribe muscle relaxant.  Encouraged follow-up with orthopedics or return to the emergency department if pain fails to improve.  Patient discharged in stable condition at this time. Final Clinical Impression(s) / ED Diagnoses Final diagnoses:  Strain of flank, initial encounter    Rx / DC Orders ED Discharge Orders          Ordered    methocarbamol (ROBAXIN) 500 MG tablet  2 times daily  07/03/21 1228              Jadalee Westcott, [place] Carolinashristian H, PA-C 07/03/21 1240    Cathren LaineSteinl, Kevin, MD 07/03/21 [phone]

## 2021-07-03 NOTE — ED Triage Notes (Signed)
Patient reports inability to bend down, L rib/flank pain w/ movement. States while he was working, he was reaching up to grab something and he sneezed and felt a pop in the L rib area w/ pain. Denies dysuria, denies N/V/D.

## 2022-07-02 IMAGING — DX DG RIBS W/ CHEST 3+V*L*
4 series · 4 of 4 positions shown · non-contrast
Comparison: Chest x-ray 02/05/2017.

CLINICAL DATA: Rib pain.

EXAM:
LEFT RIBS AND CHEST - 3+ VIEW

[chest pa]
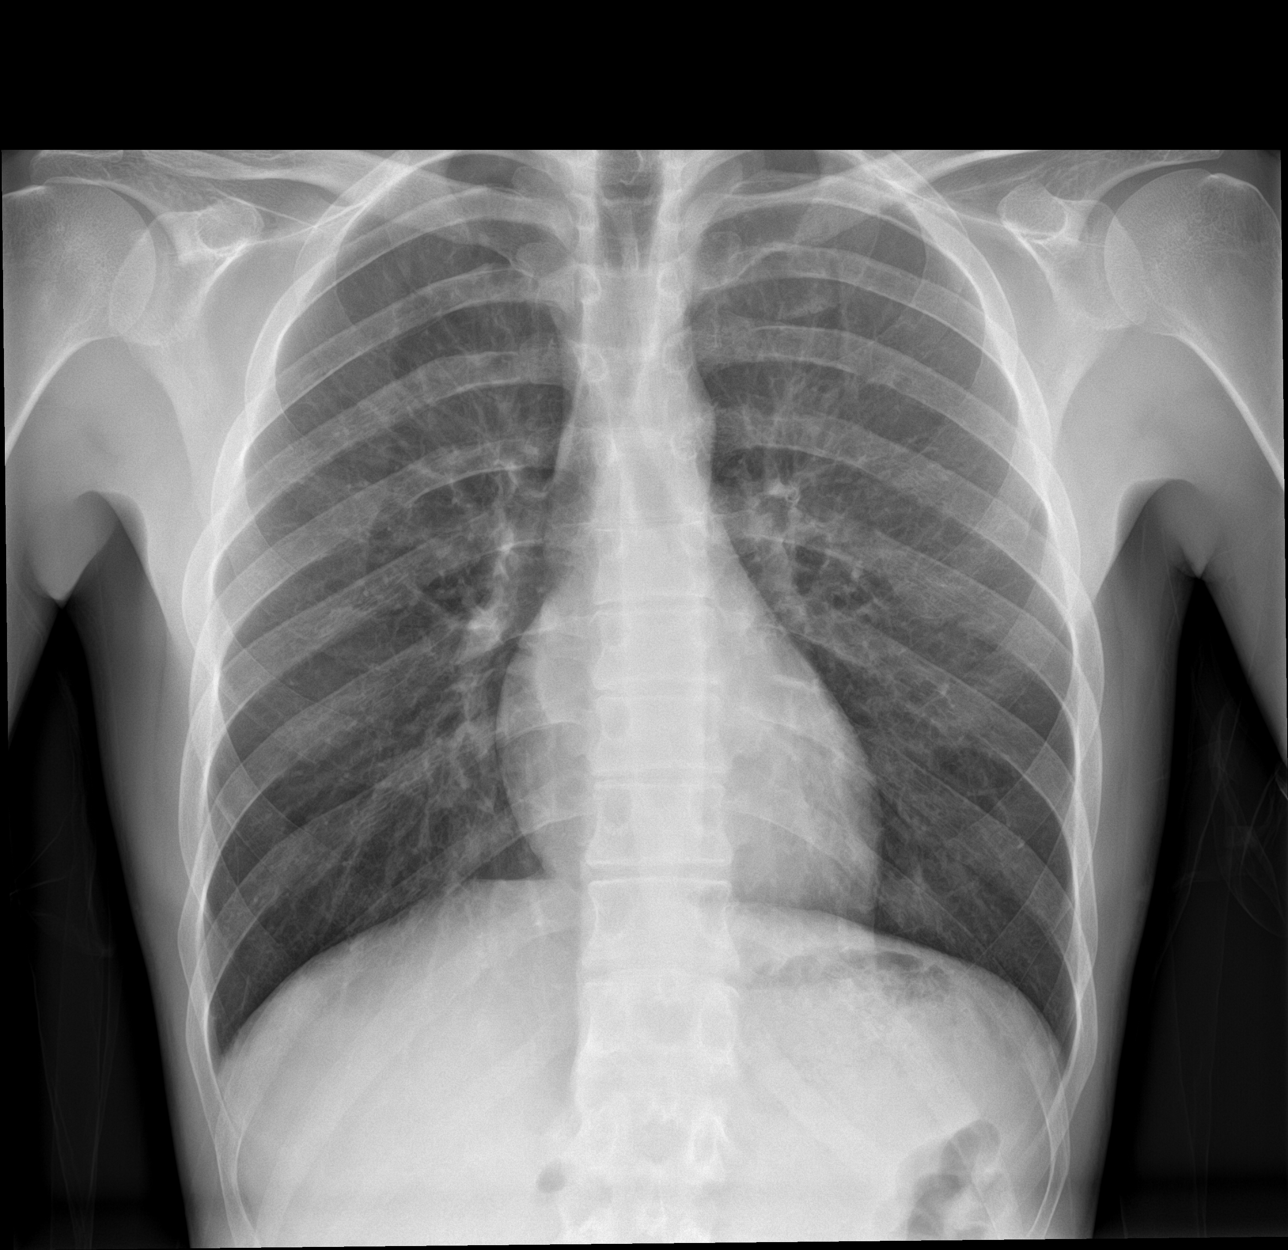

[rib ap (1 of 2)]
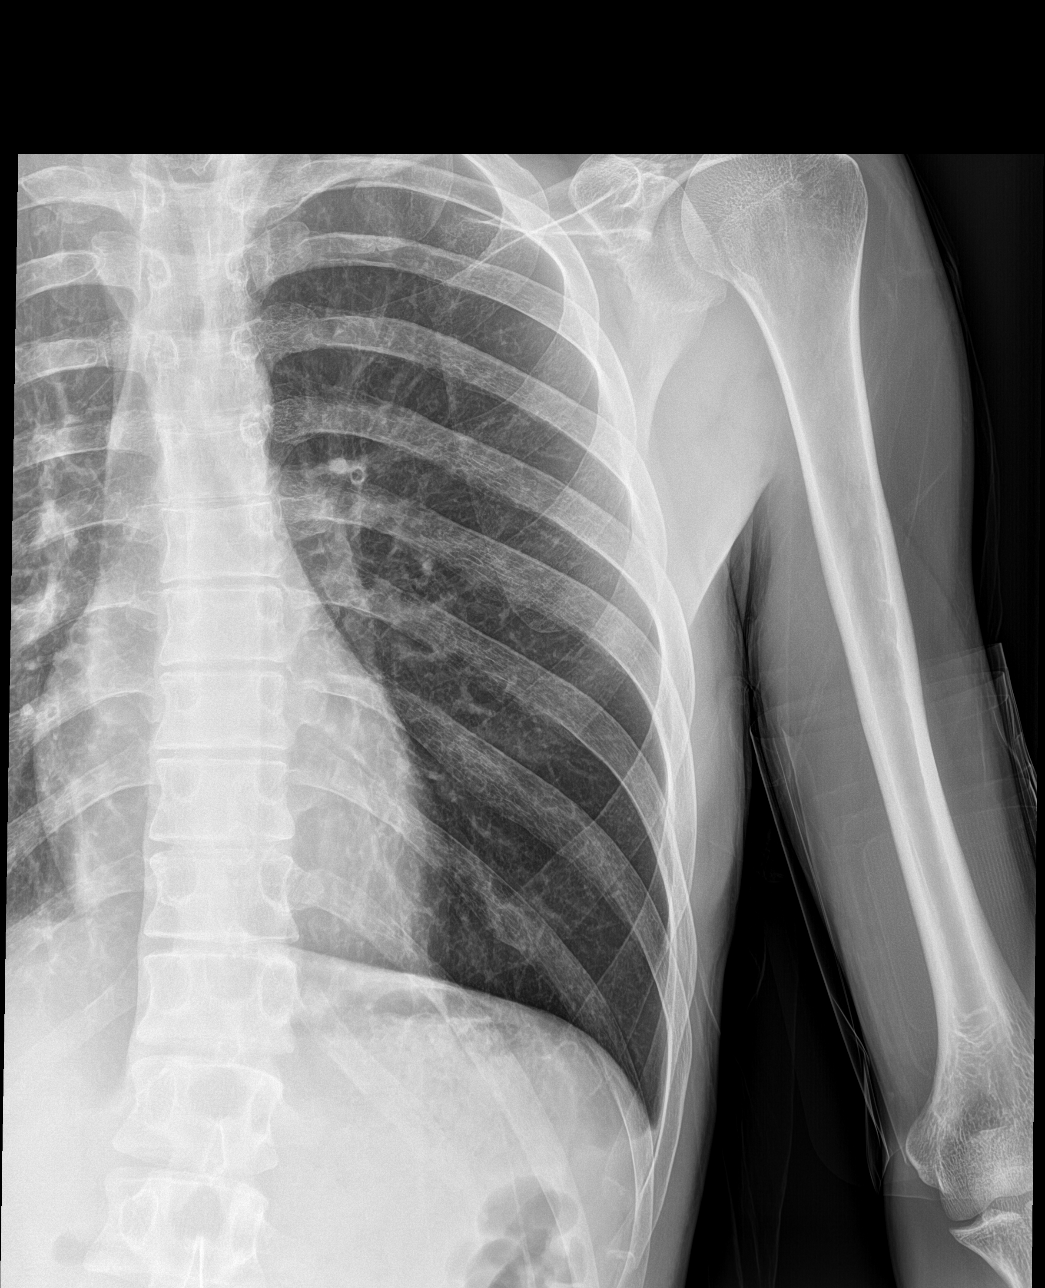

[rib ap (2 of 2)]
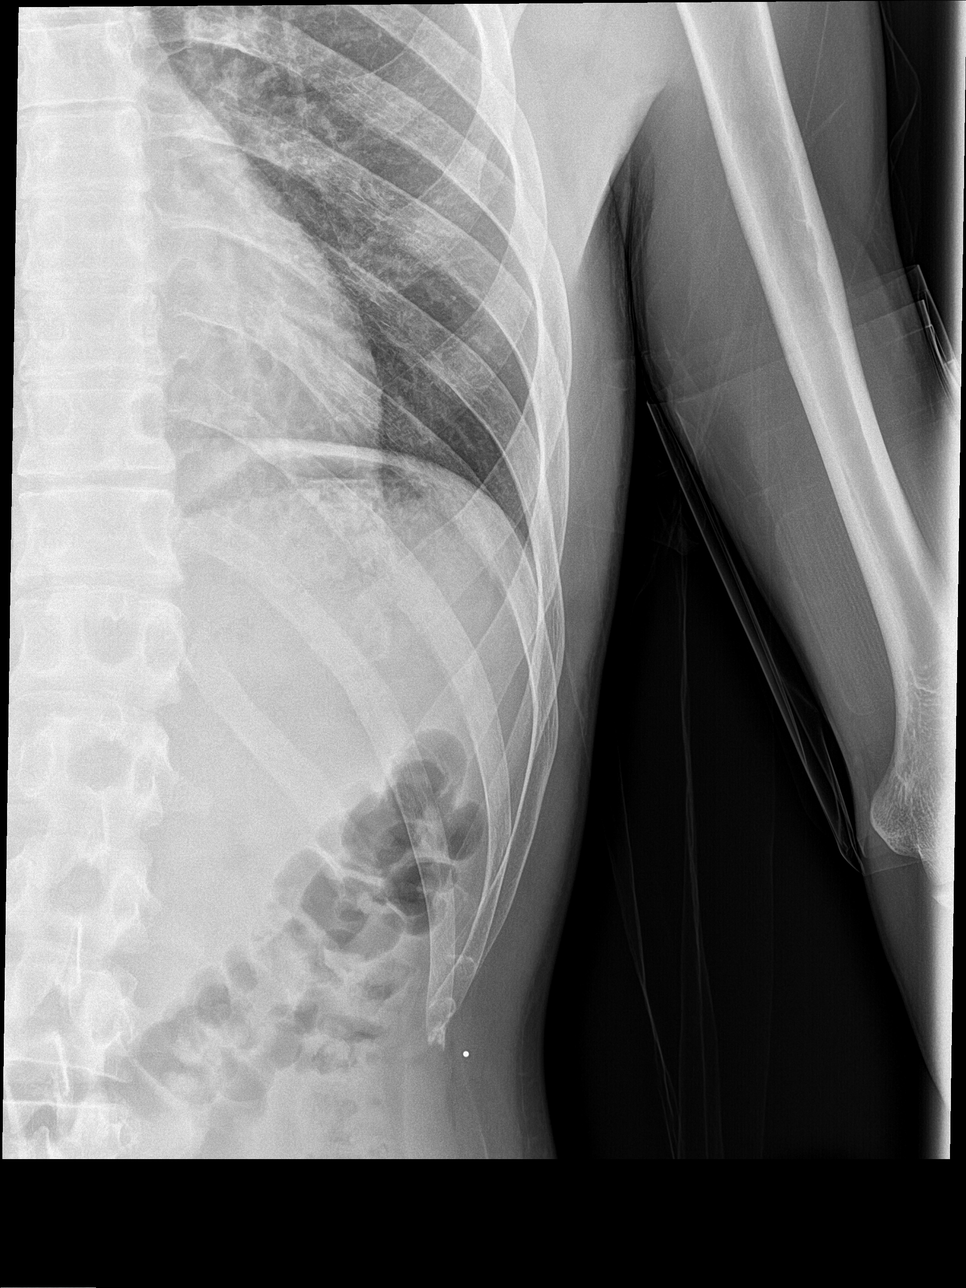

[rib obl]
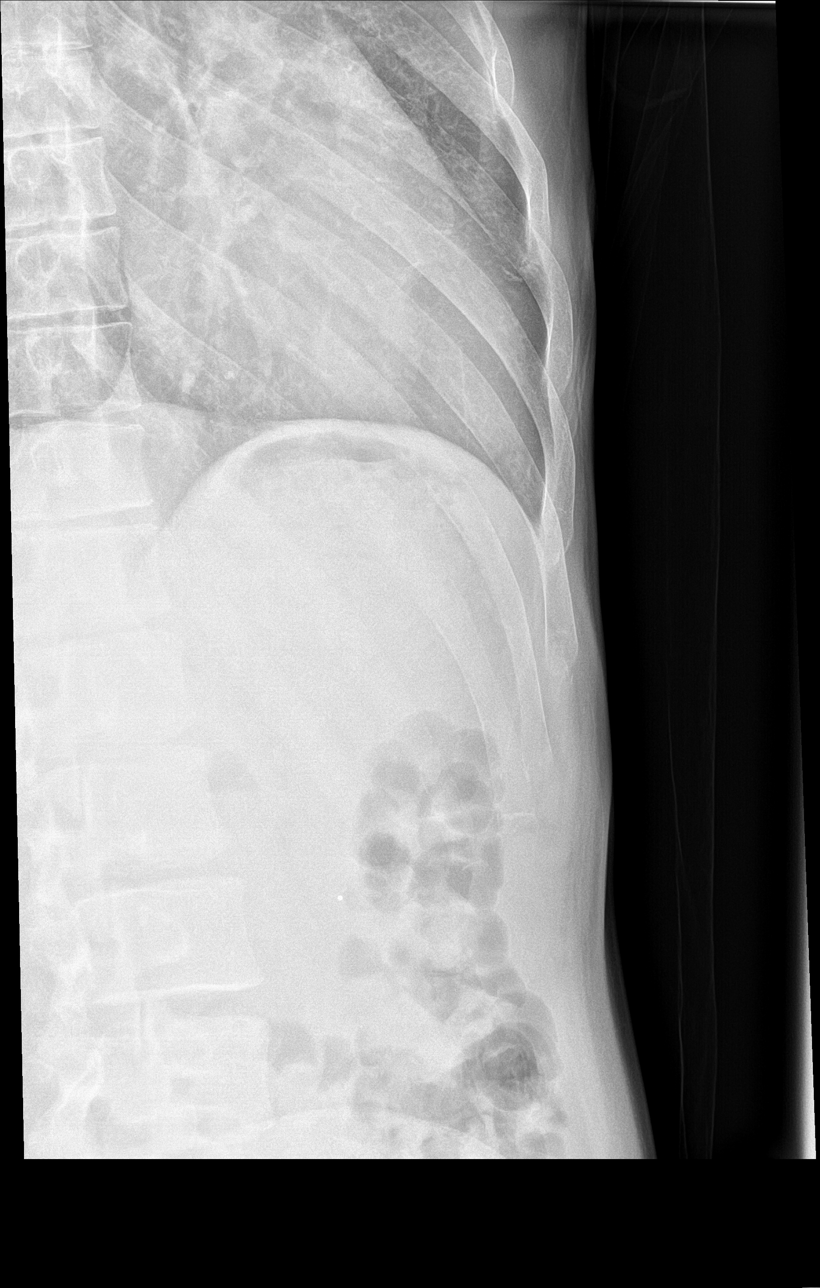

[4 of 4 positions shown; findings below may reference images not displayed]

FINDINGS: No fracture or other bone lesions are seen involving the ribs. There
is no evidence of pneumothorax or pleural effusion. Both lungs are
clear. Heart size and mediastinal contours are within normal limits.
IMPRESSION: Negative.

## 2022-07-12 IMAGING — CR DG CHEST 2V
2 series · 2 of 2 positions shown · non-contrast
Comparison: Chest and left rib series 06/23/2021 and earlier.

CLINICAL DATA: 30-year-old male with shortness of breath. Left rib
and flank pain with movement after "felt a pop". Symptoms for 1
week.

EXAM:
CHEST - 2 VIEW

[w chest pa]
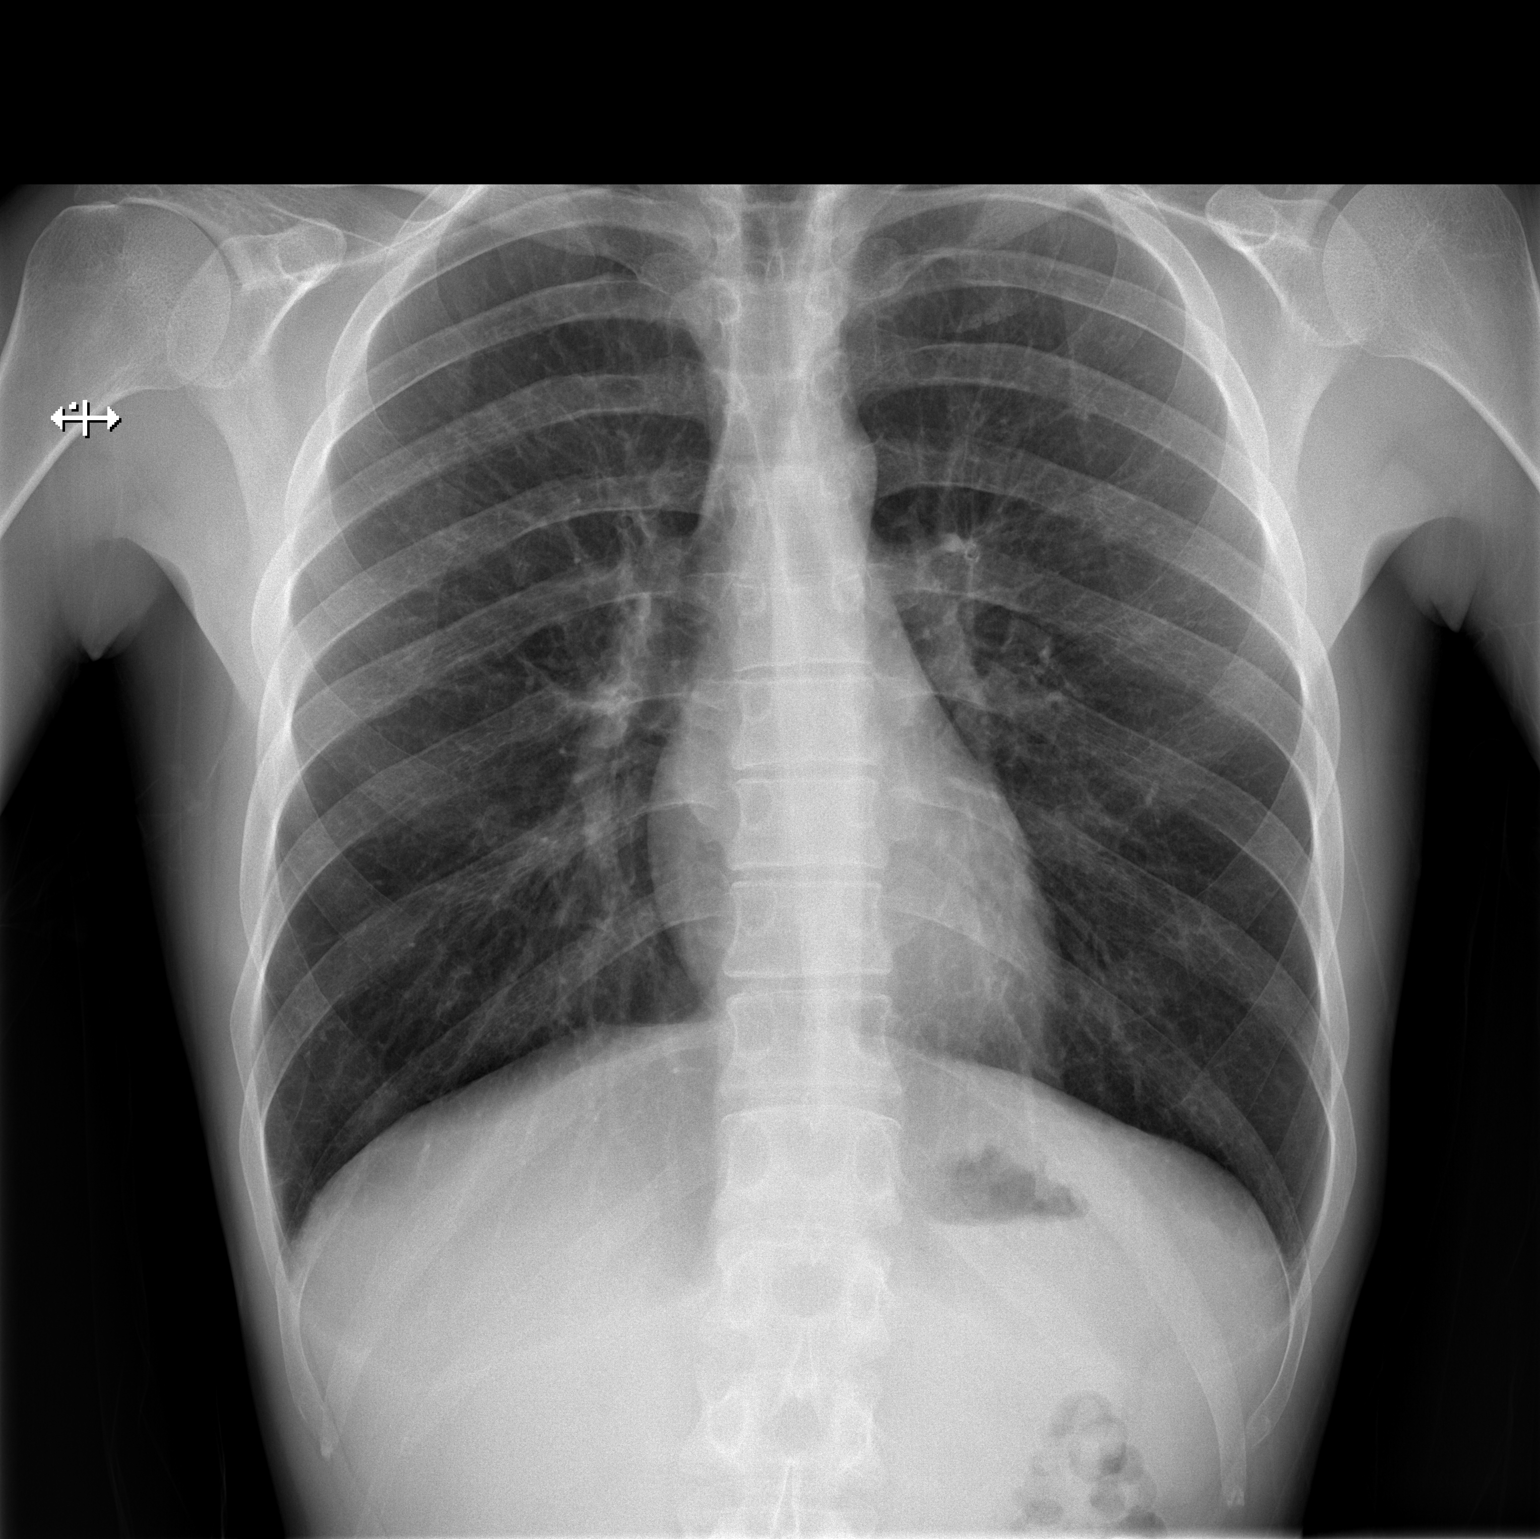

[w chest lat]
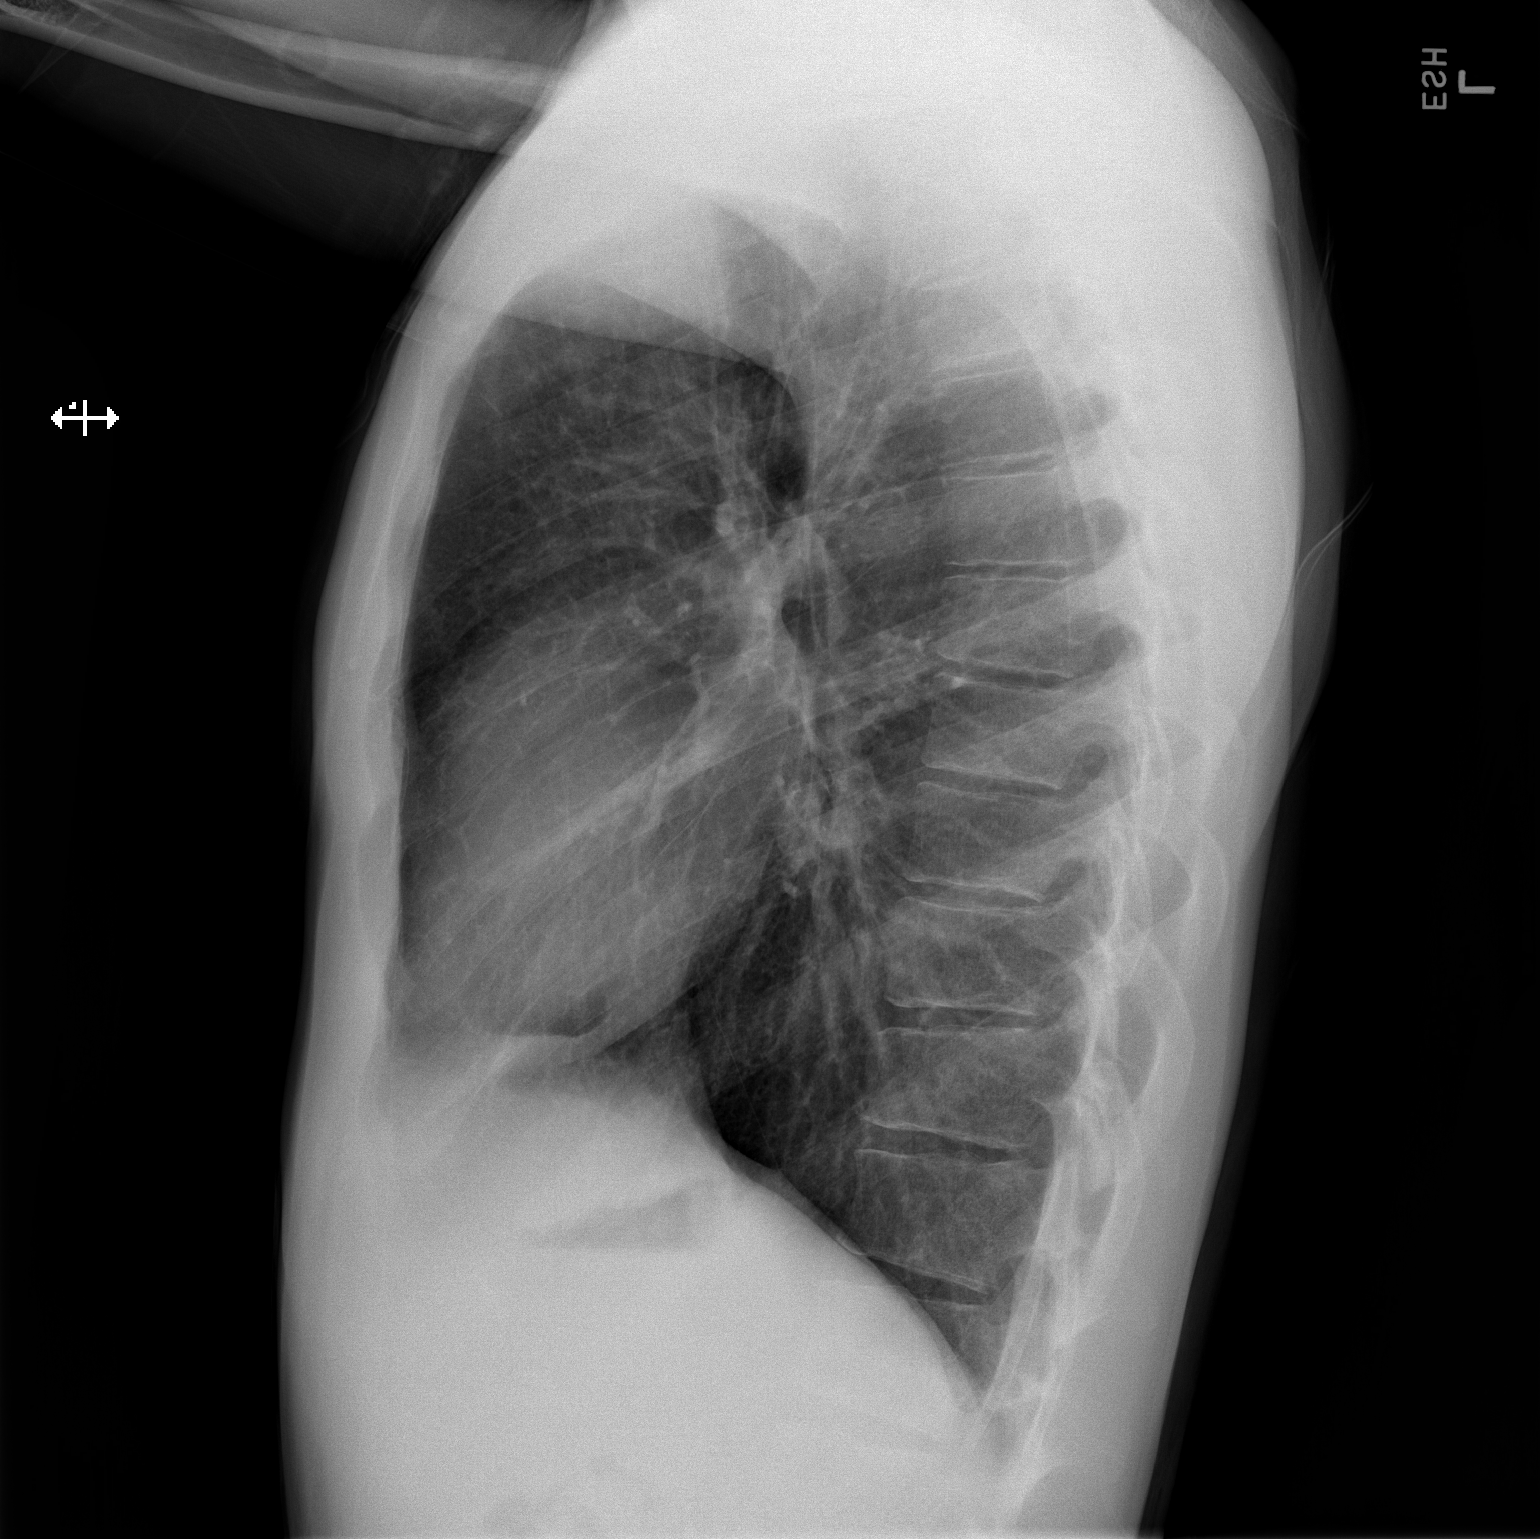

[2 of 2 positions shown; findings below may reference images not displayed]

FINDINGS: Lung volumes and mediastinal contours remain normal. Visualized
tracheal air column is within normal limits. Both lungs appear
clear. No pneumothorax or pleural effusion.

Bone mineralization is within normal limits. Left ribs appears
stable and intact. No osseous abnormality identified. Negative
visible bowel gas.
IMPRESSION: Negative. No cardiopulmonary abnormality.
